# Patient Record
Sex: Female | Born: 1950 | Race: White | Hispanic: No | Marital: Married | Smoking: Never smoker
Health system: Southern US, Community
[De-identification: ages and names within clinical notes are randomized; demographics above are authoritative.]

## PROBLEM LIST (undated history)

## (undated) DIAGNOSIS — F419 Anxiety disorder, unspecified: Secondary | ICD-10-CM

## (undated) DIAGNOSIS — K589 Irritable bowel syndrome without diarrhea: Secondary | ICD-10-CM

## (undated) DIAGNOSIS — N809 Endometriosis, unspecified: Secondary | ICD-10-CM

## (undated) DIAGNOSIS — L719 Rosacea, unspecified: Secondary | ICD-10-CM

## (undated) HISTORY — DX: Rosacea, unspecified: L71.9

## (undated) HISTORY — PX: LAPAROSCOPIC OVARIAN CYSTECTOMY: SUR786

## (undated) HISTORY — PX: PELVIC LAPAROSCOPY: SHX162

## (undated) HISTORY — DX: Irritable bowel syndrome, unspecified: K58.9

## (undated) HISTORY — DX: Endometriosis, unspecified: N80.9

## (undated) HISTORY — DX: Anxiety disorder, unspecified: F41.9

## (undated) HISTORY — PX: COLONOSCOPY: SHX174

---

## 1997-11-20 ENCOUNTER — Other Ambulatory Visit: Admission: RE | Admit: 1997-11-20 | Discharge: 1997-11-20 | Payer: Self-pay | Admitting: Obstetrics and Gynecology

## 1999-04-01 ENCOUNTER — Other Ambulatory Visit: Admission: RE | Admit: 1999-04-01 | Discharge: 1999-04-01 | Payer: Self-pay | Admitting: Obstetrics and Gynecology

## 2000-11-01 ENCOUNTER — Other Ambulatory Visit: Admission: RE | Admit: 2000-11-01 | Discharge: 2000-11-01 | Payer: Self-pay | Admitting: Obstetrics and Gynecology

## 2001-01-05 ENCOUNTER — Encounter: Admission: RE | Admit: 2001-01-05 | Discharge: 2001-01-05 | Payer: Self-pay | Admitting: Family Medicine

## 2001-01-05 ENCOUNTER — Encounter: Payer: Self-pay | Admitting: Family Medicine

## 2001-07-18 ENCOUNTER — Emergency Department (HOSPITAL_COMMUNITY): Admission: EM | Admit: 2001-07-18 | Discharge: 2001-07-18 | Payer: Self-pay | Admitting: *Deleted

## 2001-11-06 ENCOUNTER — Other Ambulatory Visit: Admission: RE | Admit: 2001-11-06 | Discharge: 2001-11-06 | Payer: Self-pay | Admitting: Obstetrics and Gynecology

## 2003-03-12 ENCOUNTER — Other Ambulatory Visit: Admission: RE | Admit: 2003-03-12 | Discharge: 2003-03-12 | Payer: Self-pay | Admitting: Obstetrics and Gynecology

## 2003-09-02 ENCOUNTER — Emergency Department (HOSPITAL_COMMUNITY): Admission: EM | Admit: 2003-09-02 | Discharge: 2003-09-02 | Payer: Self-pay | Admitting: Emergency Medicine

## 2004-06-10 ENCOUNTER — Other Ambulatory Visit: Admission: RE | Admit: 2004-06-10 | Discharge: 2004-06-10 | Payer: Self-pay | Admitting: Obstetrics and Gynecology

## 2005-08-29 ENCOUNTER — Ambulatory Visit: Payer: Self-pay | Admitting: Gastroenterology

## 2005-09-23 ENCOUNTER — Other Ambulatory Visit: Admission: RE | Admit: 2005-09-23 | Discharge: 2005-09-23 | Payer: Self-pay | Admitting: Obstetrics and Gynecology

## 2006-11-02 ENCOUNTER — Other Ambulatory Visit: Admission: RE | Admit: 2006-11-02 | Discharge: 2006-11-02 | Payer: Self-pay | Admitting: Obstetrics and Gynecology

## 2007-07-06 ENCOUNTER — Encounter: Admission: RE | Admit: 2007-07-06 | Discharge: 2007-07-06 | Payer: Self-pay | Admitting: Family Medicine

## 2008-01-08 ENCOUNTER — Ambulatory Visit: Payer: Self-pay | Admitting: Obstetrics and Gynecology

## 2008-01-08 ENCOUNTER — Other Ambulatory Visit: Admission: RE | Admit: 2008-01-08 | Discharge: 2008-01-08 | Payer: Self-pay | Admitting: Obstetrics and Gynecology

## 2008-01-08 ENCOUNTER — Encounter: Payer: Self-pay | Admitting: Obstetrics and Gynecology

## 2008-01-22 ENCOUNTER — Ambulatory Visit: Payer: Self-pay | Admitting: Obstetrics and Gynecology

## 2008-12-12 IMAGING — RF DG ESOPHAGUS
10 series · 19 of 19 positions shown · non-contrast
Comparison: none

CLINICAL DATA: Dysphasia

BARIUM ESOPHAGRAM:
TECHNIQUE: After obtaining a scout radiograph, a double-contrast upper barium
esophagram  was performed using both high-density and thin barium.

[Series 1: run · 5 of 5 slices shown (1 of 10)]
[im 1/5]
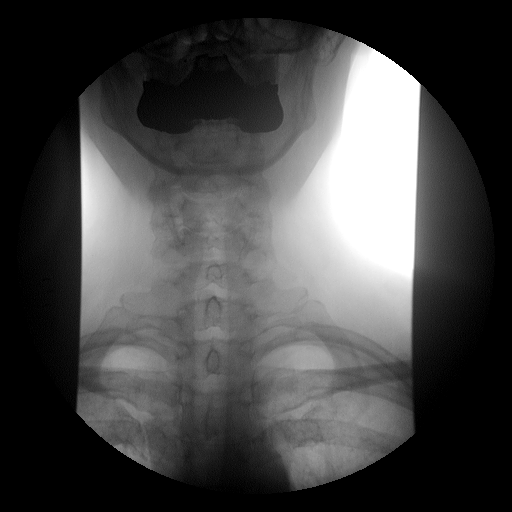
[im 2/5]
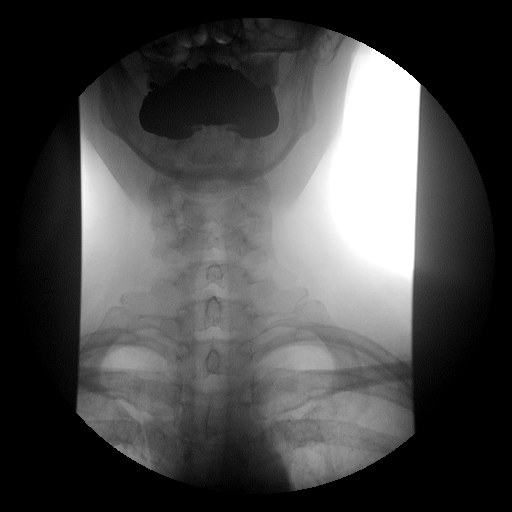
[im 3/5]
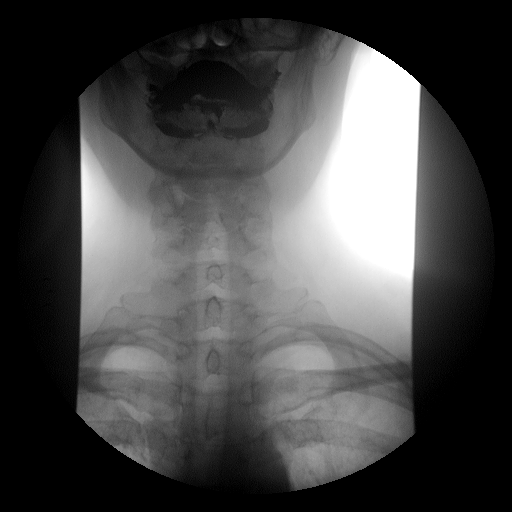
[im 4/5]
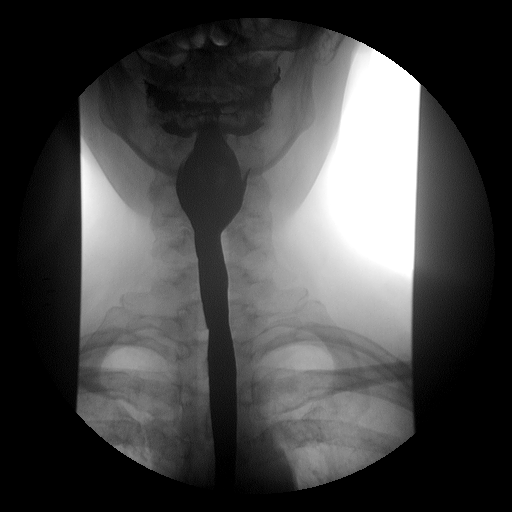
[im 5/5]
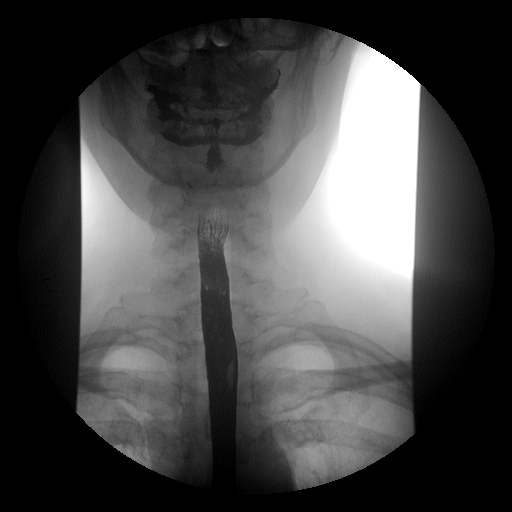

[Series 2: run · 6 of 6 slices shown (2 of 10)]
[im 1/6]
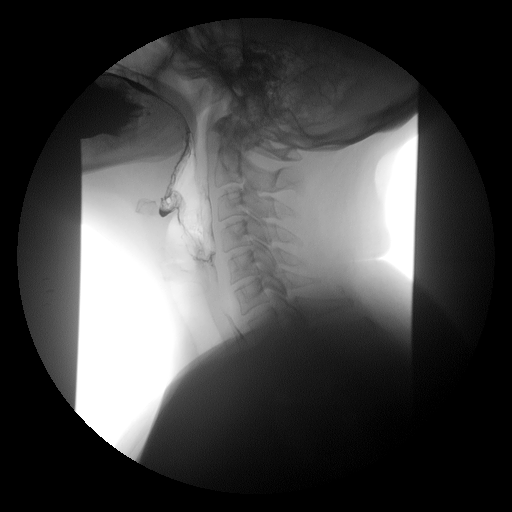
[im 2/6]
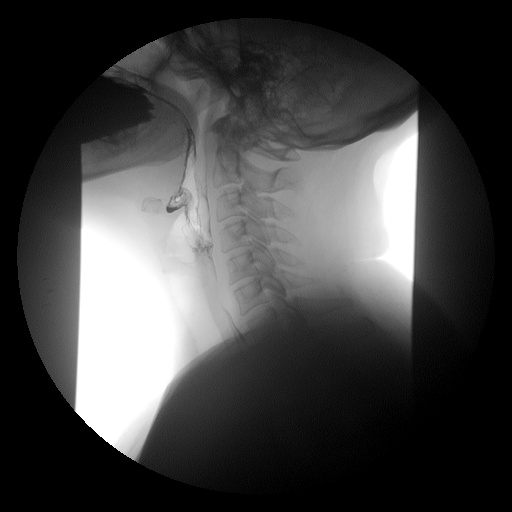
[im 3/6]
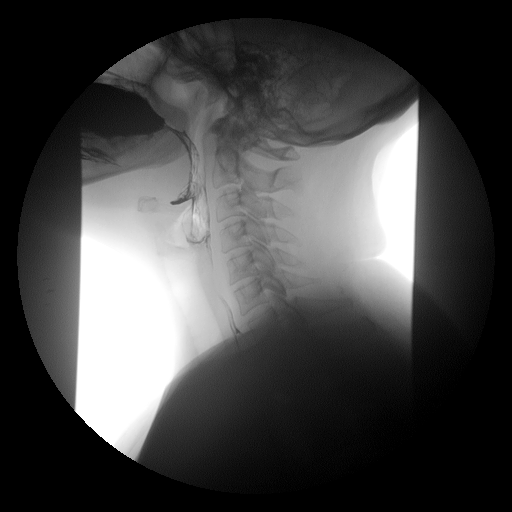
[im 4/6]
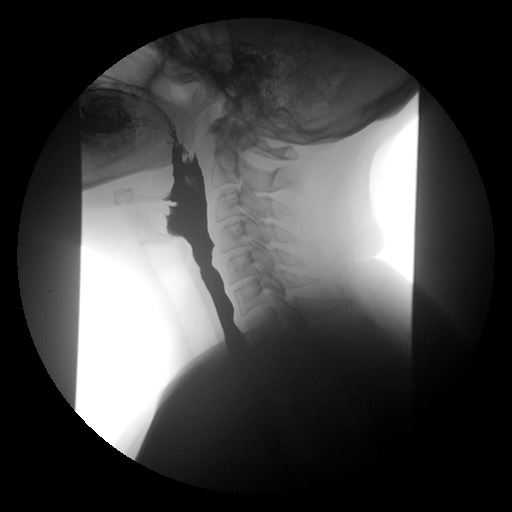
[im 5/6]
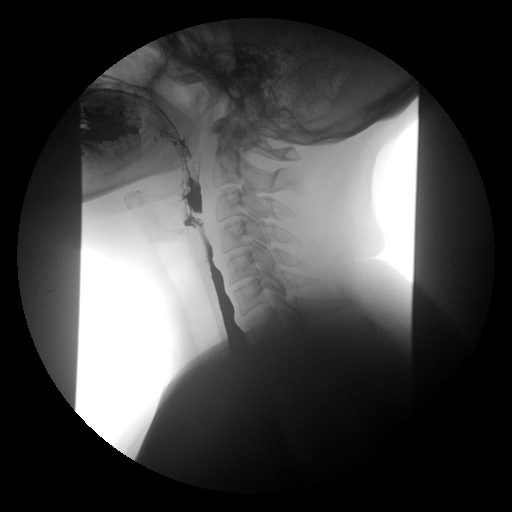
[im 6/6]
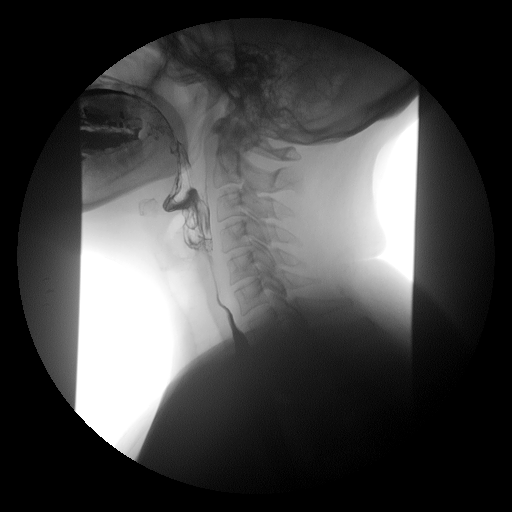

[Series 3: run · 1 of 1 slices shown (3 of 10)]
[im 1/1]
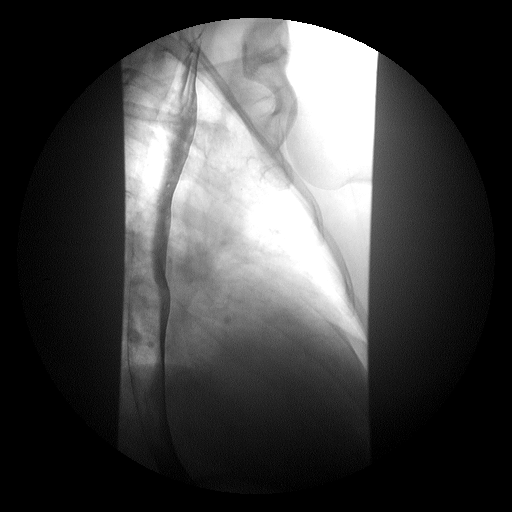

[Series 4: run · 1 of 1 slices shown (4 of 10)]
[im 1/1]
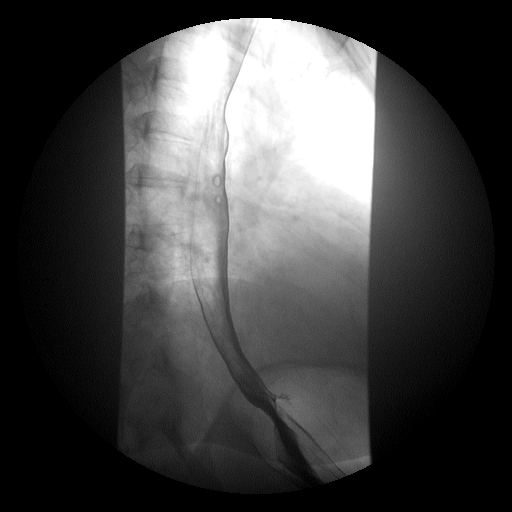

[Series 5: run · 1 of 1 slices shown (5 of 10)]
[im 1/1]
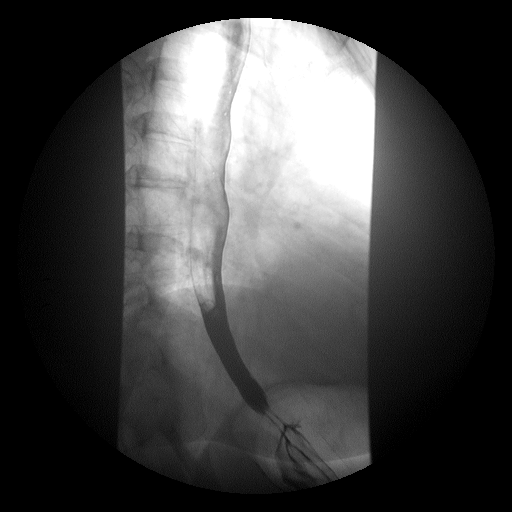

[Series 6: run · 1 of 1 slices shown (6 of 10)]
[im 1/1]
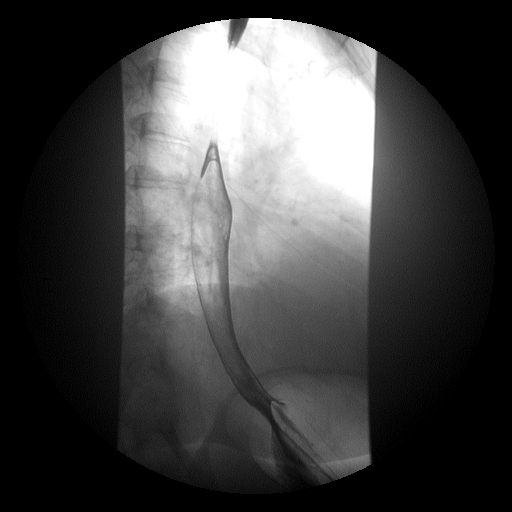

[Series 7: run · 1 of 1 slices shown (7 of 10)]
[im 1/1]
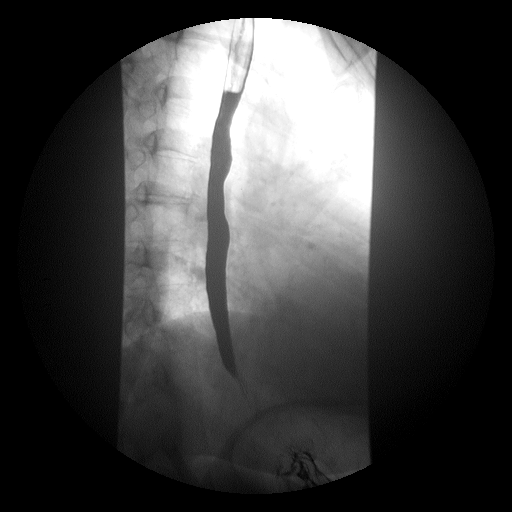

[Series 8: run · 1 of 1 slices shown (8 of 10)]
[im 1/1]
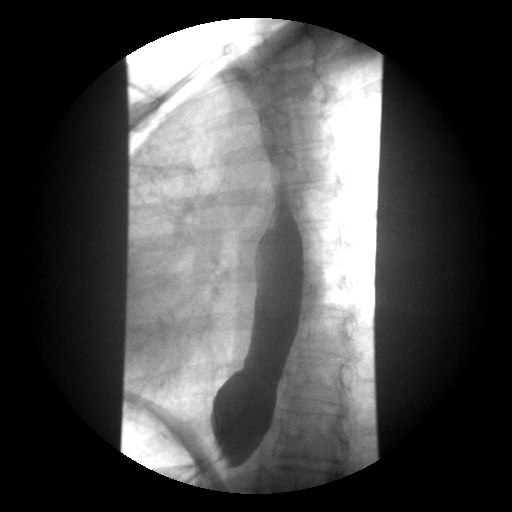

[Series 9: run · 1 of 1 slices shown (9 of 10)]
[im 1/1]
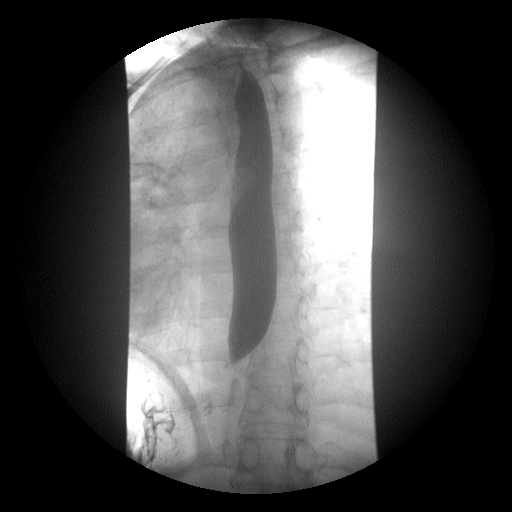

[Series 10: run · 1 of 1 slices shown (10 of 10)]
[im 1/1]
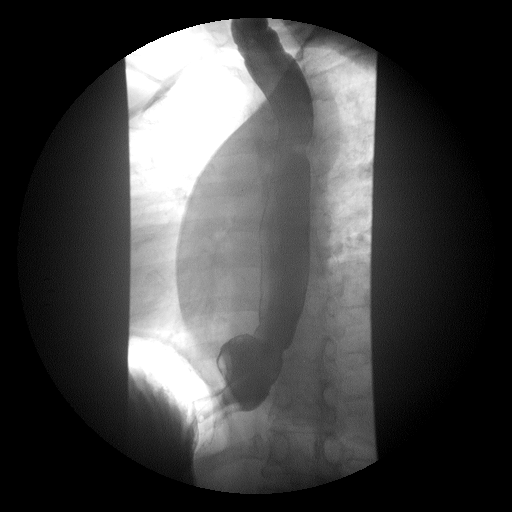

[19 of 19 positions shown; findings below may reference images not displayed]

FINDINGS: Frontal and lateral views of the hypopharynx while swallowing are
normal.  Double contrast evaluation of the esophagus is normal without evidence
for ulcer, stricture, mass effect or diverticulum.  No evidence for hiatal
hernia.  Swallowing in an RAO prone position shows disruption of primary
peristalsis on multiple swallows. A 13mm barium tablet passes readily into the
stomach when taken with water.
IMPRESSION: Nonspecific esophageal motility disorder. Otherwise normal exam.

## 2009-03-23 ENCOUNTER — Other Ambulatory Visit: Admission: RE | Admit: 2009-03-23 | Discharge: 2009-03-23 | Payer: Self-pay | Admitting: Obstetrics and Gynecology

## 2009-03-23 ENCOUNTER — Ambulatory Visit: Payer: Self-pay | Admitting: Obstetrics and Gynecology

## 2009-05-22 ENCOUNTER — Ambulatory Visit: Payer: Self-pay | Admitting: Obstetrics and Gynecology

## 2010-05-16 ENCOUNTER — Encounter: Payer: Self-pay | Admitting: Family Medicine

## 2010-06-10 ENCOUNTER — Encounter: Payer: Self-pay | Admitting: Obstetrics and Gynecology

## 2010-07-05 ENCOUNTER — Encounter: Payer: Self-pay | Admitting: Obstetrics and Gynecology

## 2010-07-12 ENCOUNTER — Encounter (INDEPENDENT_AMBULATORY_CARE_PROVIDER_SITE_OTHER): Payer: BC Managed Care – PPO | Admitting: Obstetrics and Gynecology

## 2010-07-12 ENCOUNTER — Other Ambulatory Visit: Payer: Self-pay | Admitting: Obstetrics and Gynecology

## 2010-07-12 ENCOUNTER — Other Ambulatory Visit (HOSPITAL_COMMUNITY)
Admission: RE | Admit: 2010-07-12 | Discharge: 2010-07-12 | Disposition: A | Discharge status: Home / Self Care | Payer: BC Managed Care – PPO | Source: Ambulatory Visit | Attending: Obstetrics and Gynecology | Admitting: Obstetrics and Gynecology

## 2010-07-12 DIAGNOSIS — Z124 Encounter for screening for malignant neoplasm of cervix: Secondary | ICD-10-CM | POA: Insufficient documentation

## 2010-07-12 DIAGNOSIS — Z01419 Encounter for gynecological examination (general) (routine) without abnormal findings: Secondary | ICD-10-CM

## 2010-07-12 DIAGNOSIS — R82998 Other abnormal findings in urine: Secondary | ICD-10-CM

## 2012-08-01 ENCOUNTER — Encounter: Payer: Self-pay | Admitting: Women's Health

## 2012-08-01 ENCOUNTER — Other Ambulatory Visit (HOSPITAL_COMMUNITY)
Admission: RE | Admit: 2012-08-01 | Discharge: 2012-08-01 | Disposition: A | Discharge status: Home / Self Care | Payer: BC Managed Care – PPO | Source: Ambulatory Visit | Attending: Obstetrics and Gynecology | Admitting: Obstetrics and Gynecology

## 2012-08-01 ENCOUNTER — Ambulatory Visit (INDEPENDENT_AMBULATORY_CARE_PROVIDER_SITE_OTHER): Payer: BC Managed Care – PPO | Admitting: Women's Health

## 2012-08-01 VITALS — BP 112/66 | Ht 65.75 in | Wt 144.0 lb

## 2012-08-01 DIAGNOSIS — Z780 Asymptomatic menopausal state: Secondary | ICD-10-CM

## 2012-08-01 DIAGNOSIS — Z01419 Encounter for gynecological examination (general) (routine) without abnormal findings: Secondary | ICD-10-CM | POA: Insufficient documentation

## 2012-08-01 NOTE — Patient Instructions (Addendum)

## 2012-08-01 NOTE — Progress Notes (Signed)
Terri Henderson 18-Apr-1951 [id number]    History:    The patient presents for annual exam.  Postmenopausal on no HRT with no bleeding. Normal Pap and mammogram history. Negative colonoscopy at age 62. Zostavac and normal labs at primary care. Has lost over 60 pounds in the past 4 years with diet/ exercise and stress. Normal DEXA 2009, T score 1.7 AP  Spine, bilateral hip average -0.1,  FRAX 5.2%/0.1%.   Past medical history, past surgical history, family history and social history were all reviewed and documented in the EPIC chart. 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  Husband has had numerous health issues but is doing better.   ROS:  A  ROS was performed and pertinent positives and negatives are included in the history.  Exam:  Filed Vitals:   08/01/12 1128  BP: 112/66    General appearance:  Normal Head/Neck:  Normal, without cervical or supraclavicular adenopathy. Thyroid:  Symmetrical, normal in size, without palpable masses or nodularity. Respiratory  Effort:  Normal  Auscultation:  Clear without wheezing or rhonchi Cardiovascular  Auscultation:  Regular rate, without rubs, murmurs or gallops  Edema/varicosities:  Not grossly evident Abdominal  Soft,nontender, without masses, guarding or rebound.  Liver/spleen:  No organomegaly noted  Hernia:  None appreciated  Skin  Inspection:  Grossly normal  Palpation:  Grossly normal Neurologic/psychiatric  Orientation:  Normal with appropriate conversation.  Mood/affect:  Normal  Genitourinary    Breasts: Examined lying and sitting.     Right: Without masses, retractions, discharge or axillary adenopathy.     Left: Without masses, retractions, discharge or axillary adenopathy.   Inguinal/mons:  Normal without inguinal adenopathy  External genitalia:  Normal  BUS/Urethra/Skene's glands:  Normal  Bladder:  Normal  Vagina:  Normal  Cervix:  Normal  Uterus:  normal in size, shape and  contour.  Midline and mobile  Adnexa/parametria:     Rt: Without masses or tenderness.   Lt: Without masses or tenderness.  Anus and perineum: Normal  Digital rectal exam: Normal sphincter tone without palpated masses or tenderness  Assessment/Plan:  62 y.o. M. WF G0 for annual exam with no complaints.  Normal postmenopausal exam with mild vaginal atrophy/not sexually active Normal DEXA 2009 Negative colonoscopy 2005 Labs-primary care-reports normal  Plan: Repeat DEXA, continue regular exercise, working with a trainer, calcium rich diet, vitamin D 2000 daily encouraged. Home safety and fall prevention discussed. SBE's, continue annual mammogram, had normal 1 yesterday. Home Hemoccult card given with instructions, repeat colonoscopy next year. Pap, normal Pap 2012, new screening guidelines reviewed.       Harrington Challenger [facility], 12:37 PM 08/01/2012

## 2012-08-07 ENCOUNTER — Encounter: Payer: Self-pay | Admitting: Obstetrics and Gynecology

## 2012-08-30 ENCOUNTER — Other Ambulatory Visit: Payer: Self-pay | Admitting: Gynecology

## 2012-08-30 DIAGNOSIS — Z780 Asymptomatic menopausal state: Secondary | ICD-10-CM

## 2012-09-06 ENCOUNTER — Encounter: Payer: Self-pay | Admitting: Internal Medicine

## 2012-09-18 ENCOUNTER — Ambulatory Visit (INDEPENDENT_AMBULATORY_CARE_PROVIDER_SITE_OTHER): Payer: BC Managed Care – PPO

## 2012-09-18 DIAGNOSIS — Z780 Asymptomatic menopausal state: Secondary | ICD-10-CM

## 2012-09-18 DIAGNOSIS — Z13820 Encounter for screening for osteoporosis: Secondary | ICD-10-CM

## 2012-10-01 ENCOUNTER — Telehealth: Payer: Self-pay | Admitting: *Deleted

## 2012-10-01 NOTE — Telephone Encounter (Signed)
Pt called with a couple of questions:  1. On her recent bone density pt said on the comprasion it said that she had decrease in right hip. Pt said she would like to know more about that.  2. On annual visit she spoke with you about having trouble sleeping, she use to take Ambien but stopped because of side effects. Pt said that you spoke with her about a medication with fewer sided effects?  3. Pt also said you spoke with her regarding vaginal dryness and she would like to know what she could use for this. Please advise.  Her call back # is [phone] if needed.

## 2012-10-01 NOTE — Telephone Encounter (Signed)
Telephone call, questions answered will use an over-the-counter vaginal lubricants, has not been sexually active husbands health is improving. Has Xanax 0.25 at home will try half tablet at bedtime for rest. Reviewed DEXA  FRAX 5.2%/0.1% spine T score 1.7 hip average -0.1.

## 2012-12-11 ENCOUNTER — Telehealth: Payer: Self-pay | Admitting: *Deleted

## 2012-12-11 NOTE — Telephone Encounter (Signed)
(  pt aware you are out of the office) Pt called c/o hot flashes are getting worse they come and go at night, trouble sleeping at night, problems with memory loss. Pt said this is not severe but has noticed it more frequently. I offered OV, pt asked me to relay this message to you. Please advise

## 2012-12-12 NOTE — Telephone Encounter (Signed)
Telephone call, options reviewed, is on Wellbutrin, instructed to take second dose at noon instead of after 4. Will try vitamin E twice daily. Postmenopausal 8 years, reviewed not go to start HRT at this time.

## 2012-12-12 NOTE — Telephone Encounter (Signed)
Message left

## 2013-05-22 ENCOUNTER — Encounter: Payer: Self-pay | Admitting: Internal Medicine

## 2013-07-11 ENCOUNTER — Encounter: Payer: Self-pay | Admitting: Internal Medicine

## 2013-08-14 ENCOUNTER — Ambulatory Visit (INDEPENDENT_AMBULATORY_CARE_PROVIDER_SITE_OTHER): Payer: BC Managed Care – PPO | Admitting: Women's Health

## 2013-08-14 ENCOUNTER — Encounter: Payer: Self-pay | Admitting: Women's Health

## 2013-08-14 VITALS — BP 116/70 | Ht 65.5 in | Wt 148.6 lb

## 2013-08-14 DIAGNOSIS — Z01419 Encounter for gynecological examination (general) (routine) without abnormal findings: Secondary | ICD-10-CM

## 2013-08-14 NOTE — Progress Notes (Signed)
Terri Henderson 1951/02/29 [id number]    History:    Presents for annual exam. Postmenopausal/ no HRT/ no bleeding. Normal Pap and mammogram history. Negative colonoscopy at age 63, repeat this year. Received Zostavac and had normal labs (CBC, CMP, TSH, Vit D, lipid panel) at primary care. Has lost over 60 pounds in the past 4 years with diet/ exercise and stress and has kept off. Normal DEXA 2009, T score 1.7 AP Spine, bilateral hip average -0.1, FRAX 5.2%/0.1%,  DEXA 2014 T score 0.6 AP Spine, bilateral hip average -0.4, significant decrease from 2009.  Past medical history, past surgical history, family history and social history were all reviewed and documented in the EPIC chart. [place] [employer] [place] [employer] and some tudoring. Husband has had numerous health issues but is doing better.  ROS:  A  ROS was performed and pertinent positives and negatives are included.  Exam:  Filed Vitals:   08/14/13 1202  BP: 116/70    General appearance:  Normal Thyroid:  Symmetrical, normal in size, without palpable masses or nodularity. Respiratory  Auscultation:  Clear without wheezing or rhonchi Cardiovascular  Auscultation:  Regular rate, without rubs, murmurs or gallops  Edema/varicosities:  Not grossly evident Abdominal  Soft,nontender, without masses, guarding or rebound.  Liver/spleen:  No organomegaly noted  Hernia:  None appreciated  Skin  Inspection:  Grossly normal   Breasts: Examined lying and sitting.     Right: Without masses, retractions, discharge or axillary adenopathy.     Left: Without masses, retractions, discharge or axillary adenopathy. Gentitourinary   Inguinal/mons:  Normal without inguinal adenopathy  External genitalia:  Normal  BUS/Urethra/Skene's glands:  Normal  Vagina:  atrophic  Cervix:  Normal  Uterus:  normal in size, shape and contour.  Midline and mobile  Adnexa/parametria:     Rt: Without masses or tenderness.   Lt: Without  masses or tenderness.  Anus and perineum: Normal  Digital rectal exam: Normal sphincter tone without palpated masses or tenderness  Assessment/Plan:  63 y.o.  M WF for annual exam with no complaints.    Normal postmenopausal exam with vaginal atrophy/rare sexual active  Normal DEXA 2014 but significant decrease since 2009 Labs-primary care- normal   Plan: Continue regular exercise, working with a trainer, calcium rich diet, vitamin D 2000 daily encouraged. Home safety and fall prevention discussed. SBE's, continue annual mammogram. Repeat colonoscopy. Normal Pap 2014, new screening guidelines reviewed. Repeat DEXA next year. Vaginal lubricants as needed with intercourse  Harrington Challengerancy J Allina Riches [facility], 12:46 PM 08/14/2013

## 2013-08-19 ENCOUNTER — Ambulatory Visit (AMBULATORY_SURGERY_CENTER): Payer: Self-pay | Admitting: *Deleted

## 2013-08-19 VITALS — Ht 66.0 in | Wt 150.0 lb

## 2013-08-19 DIAGNOSIS — Z1211 Encounter for screening for malignant neoplasm of colon: Secondary | ICD-10-CM

## 2013-08-19 MED ORDER — NA SULFATE-K SULFATE-MG SULF 17.5-3.13-1.6 GM/177ML PO SOLN: ORAL | Status: DC

## 2013-08-19 NOTE — Progress Notes (Signed)
Patient denies any allergies to eggs or soy. Patient denies any problems with anesthesia. No oxygen use at home, no diet/wt loss pills per patient. EMMI education given to patient on colonoscopy.

## 2013-08-21 ENCOUNTER — Encounter: Payer: Self-pay | Admitting: Internal Medicine

## 2013-09-12 ENCOUNTER — Encounter: Payer: BC Managed Care – PPO | Admitting: Internal Medicine

## 2013-10-24 ENCOUNTER — Encounter: Payer: Self-pay | Admitting: Internal Medicine

## 2013-10-24 ENCOUNTER — Ambulatory Visit (AMBULATORY_SURGERY_CENTER): Payer: BC Managed Care – PPO | Admitting: Internal Medicine

## 2013-10-24 VITALS — BP 126/71 | HR 64 | Temp 97.1°F | Resp 17 | Ht 66.0 in | Wt 150.0 lb

## 2013-10-24 DIAGNOSIS — Z1211 Encounter for screening for malignant neoplasm of colon: Secondary | ICD-10-CM

## 2013-10-24 MED ORDER — SODIUM CHLORIDE 0.9 % IV SOLN: 500.0000 mL | INTRAVENOUS | Status: DC

## 2013-10-24 NOTE — Progress Notes (Signed)
Procedure ends. To recovery, report given and VSS. 

## 2013-10-24 NOTE — Patient Instructions (Addendum)
Your colonoscopy was normal. Prep was great.  Next routine colonoscopy in 10 years - 2025  I appreciate the opportunity to care for you. Iva Booparl E. Quilla Freeze, MD, FACG  YOU HAD AN ENDOSCOPIC PROCEDURE TODAY AT THE [facility]: Refer to the procedure report that was given to you for any specific questions about what was found during the examination.  If the procedure report does not answer your questions, please call your gastroenterologist to clarify.  If you requested that your care partner not be given the details of your procedure findings, then the procedure report has been included in a sealed envelope for you to review at your convenience later.  YOU SHOULD EXPECT: Some feelings of bloating in the abdomen. Passage of more gas than usual.  Walking can help get rid of the air that was put into your GI tract during the procedure and reduce the bloating. If you had a lower endoscopy (such as a colonoscopy or flexible sigmoidoscopy) you may notice spotting of blood in your stool or on the toilet paper. If you underwent a bowel prep for your procedure, then you may not have a normal bowel movement for a few days.  DIET: Your first meal following the procedure should be a light meal and then it is ok to progress to your normal diet.  A half-sandwich or bowl of soup is an example of a good first meal.  Heavy or fried foods are harder to digest and may make you feel nauseous or bloated.  Likewise meals heavy in dairy and vegetables can cause extra gas to form and this can also increase the bloating.  Drink plenty of fluids but you should avoid alcoholic beverages for 24 hours.  ACTIVITY: Your care partner should take you home directly after the procedure.  You should plan to take it easy, moving slowly for the rest of the day.  You can resume normal activity the day after the procedure however you should NOT DRIVE or use heavy machinery for 24 hours (because of the sedation medicines used during  the test).    SYMPTOMS TO REPORT IMMEDIATELY: A gastroenterologist can be reached at any hour.  During normal business hours, 8:30 AM to 5:00 PM Monday through Friday, call [phone].  After hours and on weekends, please call the GI answering service at [phone] who will take a message and have the physician on call contact you.   Following lower endoscopy (colonoscopy or flexible sigmoidoscopy):  Excessive amounts of blood in the stool  Significant tenderness or worsening of abdominal pains  Swelling of the abdomen that is new, acute  Fever of 100F or higher    FOLLOW UP: If any biopsies were taken you will be contacted by phone or by letter within the next 1-3 weeks.  Call your gastroenterologist if you have not heard about the biopsies in 3 weeks.  Our staff will call the home number listed on your records the next business day following your procedure to check on you and address any questions or concerns that you may have at that time regarding the information given to you following your procedure. This is a courtesy call and so if there is no answer at the home number and we have not heard from you through the emergency physician on call, we will assume that you have returned to your regular daily activities without incident.  SIGNATURES/CONFIDENTIALITY: You and/or your care partner have signed paperwork which will be entered into your electronic  medical record.  These signatures attest to the fact that that the information above on your After Visit Summary has been reviewed and is understood.  Full responsibility of the confidentiality of this discharge information lies with you and/or your care-partner.  Try Miralax every day per Dr. Marvell FullerGessner's advices.

## 2013-10-24 NOTE — Op Note (Signed)
[facility] [address] [place], [id number]   COLONOSCOPY PROCEDURE REPORT  PATIENT: Terri Henderson, Terri O.  MR#: [id number] BIRTHDATE: 1951-04-22 , 62  yrs. old GENDER: Female ENDOSCOPIST: Iva Booparl E Gessner, MD, [facility] PROCEDURE DATE:  10/24/2013 PROCEDURE:   Colonoscopy, screening First Screening Colonoscopy - Avg.  risk and is 50 yrs.  old or older - No.  Prior Negative Screening - Now for repeat screening. 10 or more years since last screening  History of Adenoma - Now for follow-up colonoscopy & has been > or = to 3 yrs.  N/A  Polyps Removed Today? No.  Recommend repeat exam, <10 yrs? No. ASA CLASS:   Class I INDICATIONS:average risk screening and Last colonoscopy performed 10 years ago. MEDICATIONS: propofol (Diprivan) 250mg  IV, MAC sedation, administered by CRNA, and These medications were titrated to patient response per physician's verbal order  DESCRIPTION OF PROCEDURE:   After the risks benefits and alternatives of the procedure were thoroughly explained, informed consent was obtained.  A digital rectal exam revealed no abnormalities of the rectum.   The LB [id number]  endoscope was introduced through the anus and advanced to the cecum, which was identified by both the appendix and ileocecal valve. No adverse events experienced.   The quality of the prep was excellent using Suprep  The instrument was then slowly withdrawn as the colon was fully examined.      COLON FINDINGS: A normal appearing cecum, ileocecal valve, and appendiceal orifice were identified.  The ascending, hepatic flexure, transverse, splenic flexure, descending, sigmoid colon and rectum appeared unremarkable.  No polyps or cancers were seen. Retroflexed views revealed no abnormalities. The time to cecum=7 minutes 59 seconds.  Withdrawal time=6 minutes 50 seconds.  The scope was withdrawn and the procedure completed. COMPLICATIONS: There were no complications.  ENDOSCOPIC  IMPRESSION: Normal colonoscopy - excellent prep  RECOMMENDATIONS: Repeat colonoscopy 10 years - 2025   eSigned:  Iva Booparl E Gessner, MD, [facility]AC[facility] 10/24/2013 1:51 PM   cc: The Patient and Mila PalmerSharon Wolters, MD

## 2013-10-28 ENCOUNTER — Telehealth: Payer: Self-pay | Admitting: *Deleted

## 2013-10-28 NOTE — Telephone Encounter (Signed)
  Follow up Call-  Call back number 10/24/2013  Post procedure Call Back phone  # [phone]  Permission to leave phone message Yes     Patient questions:  Message left to call us if necessary.

## 2014-02-28 ENCOUNTER — Encounter: Payer: Self-pay | Admitting: Women's Health

## 2014-07-04 ENCOUNTER — Telehealth: Payer: Self-pay | Admitting: *Deleted

## 2014-07-04 NOTE — Telephone Encounter (Signed)
(  pt aware you are out of the office) Pt called c/o this am of some abdomen discomfort that lasted about 30 minutes, said discomfort was bilateral. She used heating pad and elevated her legs and took pain medication and felt better. I advised pt if the should occur again or worsen to go to ER after hours. Pt asked me to relay this information to you.

## 2014-07-07 NOTE — Telephone Encounter (Signed)
Please call and have her schedule office visit if symptoms persist,  Will check UA, wet prep and  if needed may need us

## 2014-07-07 NOTE — Telephone Encounter (Signed)
Pt informed with the below note. 

## 2014-08-20 ENCOUNTER — Encounter: Payer: BC Managed Care – PPO | Admitting: Women's Health

## 2014-09-15 ENCOUNTER — Encounter: Payer: Self-pay | Admitting: Women's Health

## 2014-09-15 ENCOUNTER — Ambulatory Visit (INDEPENDENT_AMBULATORY_CARE_PROVIDER_SITE_OTHER): Payer: BC Managed Care – PPO | Admitting: Women's Health

## 2014-09-15 VITALS — BP 112/70 | Ht 65.0 in | Wt 151.0 lb

## 2014-09-15 DIAGNOSIS — Z01419 Encounter for gynecological examination (general) (routine) without abnormal findings: Secondary | ICD-10-CM

## 2014-09-15 NOTE — Progress Notes (Signed)
Terri Henderson 03-01-1951 [id number]    History:    Presents for annual exam.  Postmenopausal/no HRT/no bleeding. History of endometriosis. Normal Pap and mammogram history. 2015 negative colonoscopy. History of rosacea, anxiety and depression on Wellbutrin and Prozac per primary care and IBS. Normal labs at primary care. Had lost 60 pounds in the past and has kept it off.  Past medical history, past surgical history, family history and social history were all reviewed and documented in the EPIC chart. [place] [employer] [place] [employer] [facility] [place].  ROS:  A ROS was performed and pertinent positives and negatives are included.  Exam:  Filed Vitals:   09/15/14 1041  BP: 112/70    General appearance:  Normal Thyroid:  Symmetrical, normal in size, without palpable masses or nodularity. Respiratory  Auscultation:  Clear without wheezing or rhonchi Cardiovascular  Auscultation:  Regular rate, without rubs, murmurs or gallops  Edema/varicosities:  Not grossly evident Abdominal  Soft,nontender, without masses, guarding or rebound.  Liver/spleen:  No organomegaly noted  Hernia:  None appreciated  Skin  Inspection:  Grossly normal   Breasts: Examined lying and sitting.     Right: Without masses, retractions, discharge or axillary adenopathy.     Left: Without masses, retractions, discharge or axillary adenopathy. Gentitourinary   Inguinal/mons:  Normal without inguinal adenopathy  External genitalia:  Normal  BUS/Urethra/Skene's glands:  Normal  Vagina:  Normal  Cervix:  Normal  Uterus:   normal in size, shape and contour.  Midline and mobile  Adnexa/parametria:     Rt: Without masses or tenderness.   Lt: Without masses or tenderness.  Anus and perineum: Normal  Digital rectal exam: Normal sphincter tone without palpated masses or tenderness  Assessment/Plan:  64 y.o. MWF G0 for annual exam with complaint of occasional insomnia.  Postmenopausal/no HRT/no  bleeding Rosacea- dermatologist manages Anxiety/depression stable on Wellbutrin and Prozac per primary care  Plan: SBE's, continue annual screening mammogram, calcium rich diet, vitamin D 1000 daily encouraged. Had normal vitamin D at primary care. Home safety, fall prevention discussed and importance of continued regular exercise for bone health. Pap normal 2014, new screening guidelines reviewed. Sleep hygiene reviewed. Normal DEXA 2014 will repeat next year.  Harrington ChallengerYOUNG,Terri Henderson [facility]WHNP, 1:43 PM 09/15/2014

## 2014-09-15 NOTE — Patient Instructions (Signed)

## 2015-06-23 ENCOUNTER — Encounter: Payer: Self-pay | Admitting: Women's Health

## 2015-11-03 ENCOUNTER — Ambulatory Visit (INDEPENDENT_AMBULATORY_CARE_PROVIDER_SITE_OTHER): Payer: BC Managed Care – PPO | Admitting: Women's Health

## 2015-11-03 ENCOUNTER — Encounter: Payer: Self-pay | Admitting: Women's Health

## 2015-11-03 VITALS — BP 120/78 | Ht 65.0 in | Wt 161.0 lb

## 2015-11-03 DIAGNOSIS — Z01419 Encounter for gynecological examination (general) (routine) without abnormal findings: Secondary | ICD-10-CM

## 2015-11-03 DIAGNOSIS — Z13820 Encounter for screening for osteoporosis: Secondary | ICD-10-CM

## 2015-11-03 NOTE — Addendum Note (Signed)
Addended by: Kem ParkinsonBARNES, Haivyn Oravec on: 11/03/2015 12:55 PM   Modules accepted: Orders, SmartSet

## 2015-11-03 NOTE — Patient Instructions (Signed)

## 2015-11-03 NOTE — Progress Notes (Signed)
Terri Henderson 12-Aug-1950 [id number]    History:    Presents for annual exam.  Postmenopausal on no HRT with no bleeding. History of endometriosis. Normal Pap and mammogram history. 2015 negative colonoscopy. Anxiety and depression managed by primary care. Rosacea dermatologist manages. Normal DEXA 2014 hip average -0.3.  Past medical history, past surgical history, family history and social history were all reviewed and documented in the EPIC chart. [facility] [employer] [place] [employer].  ROS:  A ROS was performed and pertinent positives and negatives are included.  Exam:  Filed Vitals:   11/03/15 1204  BP: 120/78    General appearance:  Normal Thyroid:  Symmetrical, normal in size, without palpable masses or nodularity. Respiratory  Auscultation:  Clear without wheezing or rhonchi Cardiovascular  Auscultation:  Regular rate, without rubs, murmurs or gallops  Edema/varicosities:  Not grossly evident Abdominal  Soft,nontender, without masses, guarding or rebound.  Liver/spleen:  No organomegaly noted  Hernia:  None appreciated  Skin  Inspection:  Grossly normal   Breasts: Examined lying and sitting.     Right: Without masses, retractions, discharge or axillary adenopathy.     Left: Without masses, retractions, discharge or axillary adenopathy. Gentitourinary   Inguinal/mons:  Normal without inguinal adenopathy  External genitalia:  Normal  BUS/Urethra/Skene's glands:  Normal  Vagina:  Normal  Cervix:  Normal  Uterus:   normal in size, shape and contour.  Midline and mobile  Adnexa/parametria:     Rt: Without masses or tenderness.   Lt: Without masses or tenderness.  Anus and perineum: Normal  Digital rectal exam: Normal sphincter tone without palpated masses or tenderness  Assessment/Plan:  65 y.o. MWF G0 for annual exam with no complaints.  Postmenopausal on no HRT with no bleeding Anxiety and depression labs and meds managed by primary  care Rosacea-dermatologist  Plan: DEXA. Reviewed importance of continuing weightbearing exercise, home safety and fall prevention discussed. Zostavax recommended . SBE's, continue annual 3-D screening mammogram, calcium rich diet, vitamin D 2000 daily encouraged. Instructed to have vitamin D level checked at primary care. Continue vaginal lubricants with intercourse. UA, Pap with HR HPV typing, new screening guidelines reviewed.  Harrington ChallengerYOUNG,NANC[facility] CenterWHNP, 12:48 PM 11/03/2015

## 2015-11-04 LAB — PAP IG W/ RFLX HPV ASCU

## 2015-11-23 ENCOUNTER — Other Ambulatory Visit: Payer: Self-pay | Admitting: Women's Health

## 2015-11-23 ENCOUNTER — Other Ambulatory Visit: Payer: Self-pay | Admitting: Gynecology

## 2015-11-23 ENCOUNTER — Ambulatory Visit (INDEPENDENT_AMBULATORY_CARE_PROVIDER_SITE_OTHER): Payer: BC Managed Care – PPO

## 2015-11-23 DIAGNOSIS — M8580 Other specified disorders of bone density and structure, unspecified site: Secondary | ICD-10-CM

## 2015-11-23 DIAGNOSIS — M899 Disorder of bone, unspecified: Secondary | ICD-10-CM

## 2015-11-23 DIAGNOSIS — Z13820 Encounter for screening for osteoporosis: Secondary | ICD-10-CM | POA: Diagnosis not present

## 2015-11-24 ENCOUNTER — Other Ambulatory Visit: Payer: Self-pay | Admitting: Gynecology

## 2015-11-24 DIAGNOSIS — M8580 Other specified disorders of bone density and structure, unspecified site: Secondary | ICD-10-CM

## 2015-11-25 ENCOUNTER — Other Ambulatory Visit: Payer: BC Managed Care – PPO

## 2015-11-25 DIAGNOSIS — M8580 Other specified disorders of bone density and structure, unspecified site: Secondary | ICD-10-CM

## 2015-11-26 LAB — VITAMIN D 25 HYDROXY (VIT D DEFICIENCY, FRACTURES): Vit D, 25-Hydroxy: 32 ng/mL (ref 30–100)

## 2015-11-26 LAB — PTH, INTACT AND CALCIUM
CALCIUM: 9.1 mg/dL (ref 8.4–10.5)
PTH: 36 pg/mL (ref 14–64)

## 2015-12-03 ENCOUNTER — Telehealth: Payer: Self-pay | Admitting: *Deleted

## 2015-12-03 NOTE — Telephone Encounter (Signed)
Pt called requesting lab results on 11/25/15 for vitamin d and pth and calcium

## 2015-12-07 ENCOUNTER — Ambulatory Visit
Admission: RE | Admit: 2015-12-07 | Discharge: 2015-12-07 | Disposition: A | Discharge status: Home / Self Care | Payer: BC Managed Care – PPO | Source: Ambulatory Visit | Attending: Family Medicine | Admitting: Family Medicine

## 2015-12-07 ENCOUNTER — Other Ambulatory Visit: Payer: Self-pay | Admitting: Family Medicine

## 2015-12-07 DIAGNOSIS — M79675 Pain in left toe(s): Secondary | ICD-10-CM

## 2015-12-07 DIAGNOSIS — M79672 Pain in left foot: Secondary | ICD-10-CM

## 2016-08-16 ENCOUNTER — Encounter: Payer: Self-pay | Admitting: Women's Health

## 2016-09-07 ENCOUNTER — Encounter: Payer: Self-pay | Admitting: Gynecology

## 2017-05-15 IMAGING — CR DG FOOT COMPLETE 3+V*L*
3 series · 3 of 3 positions shown · non-contrast
Comparison: None.

CLINICAL DATA: Injury to left foot 1 day ago. Pain and bruising to
fourth and fifth toes and metatarsals.

EXAM:
LEFT FOOT - COMPLETE 3+ VIEW

[t foot ap left]
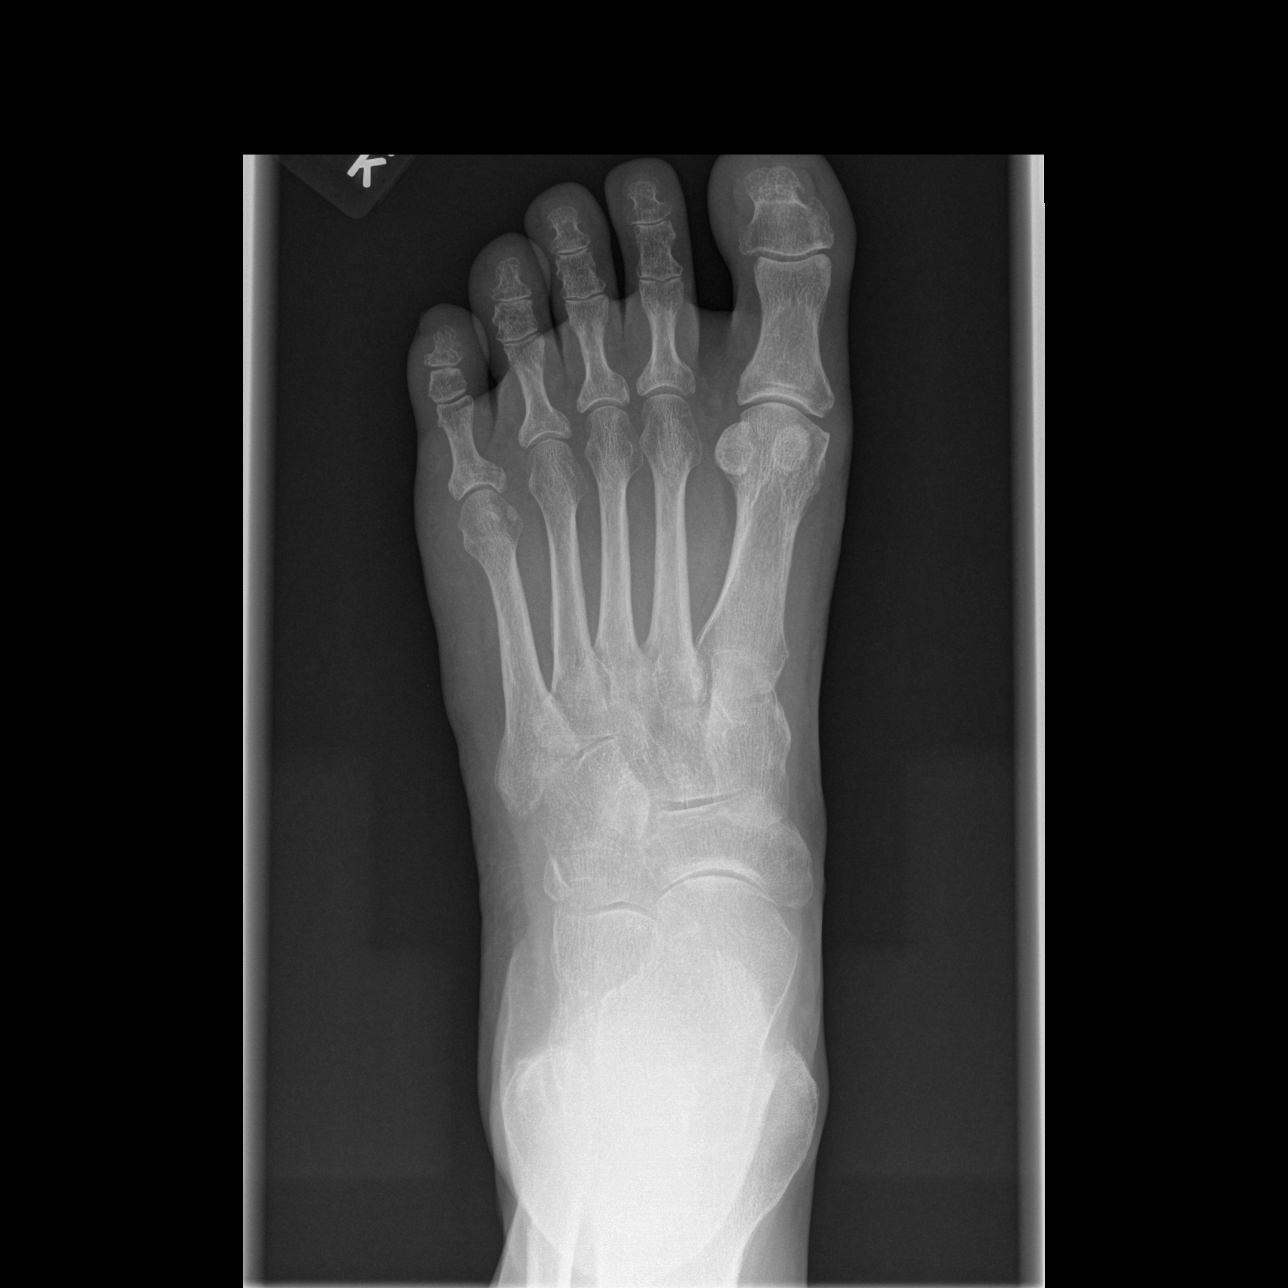

[t foot oblique left]
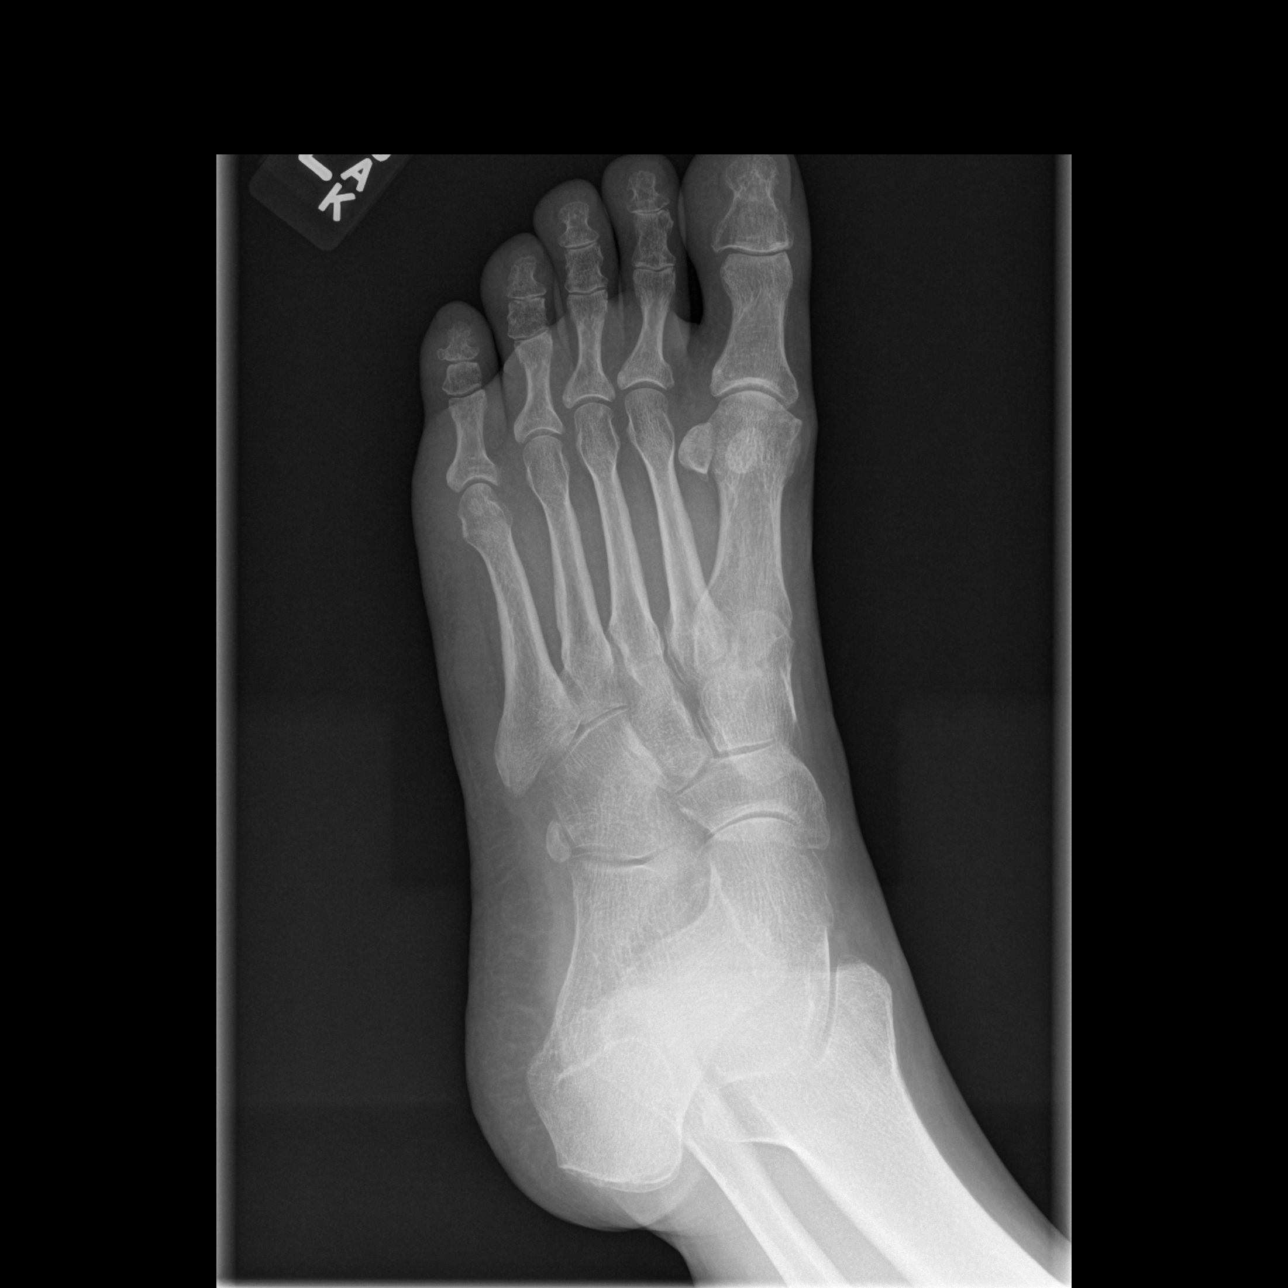

[t foot lat left]
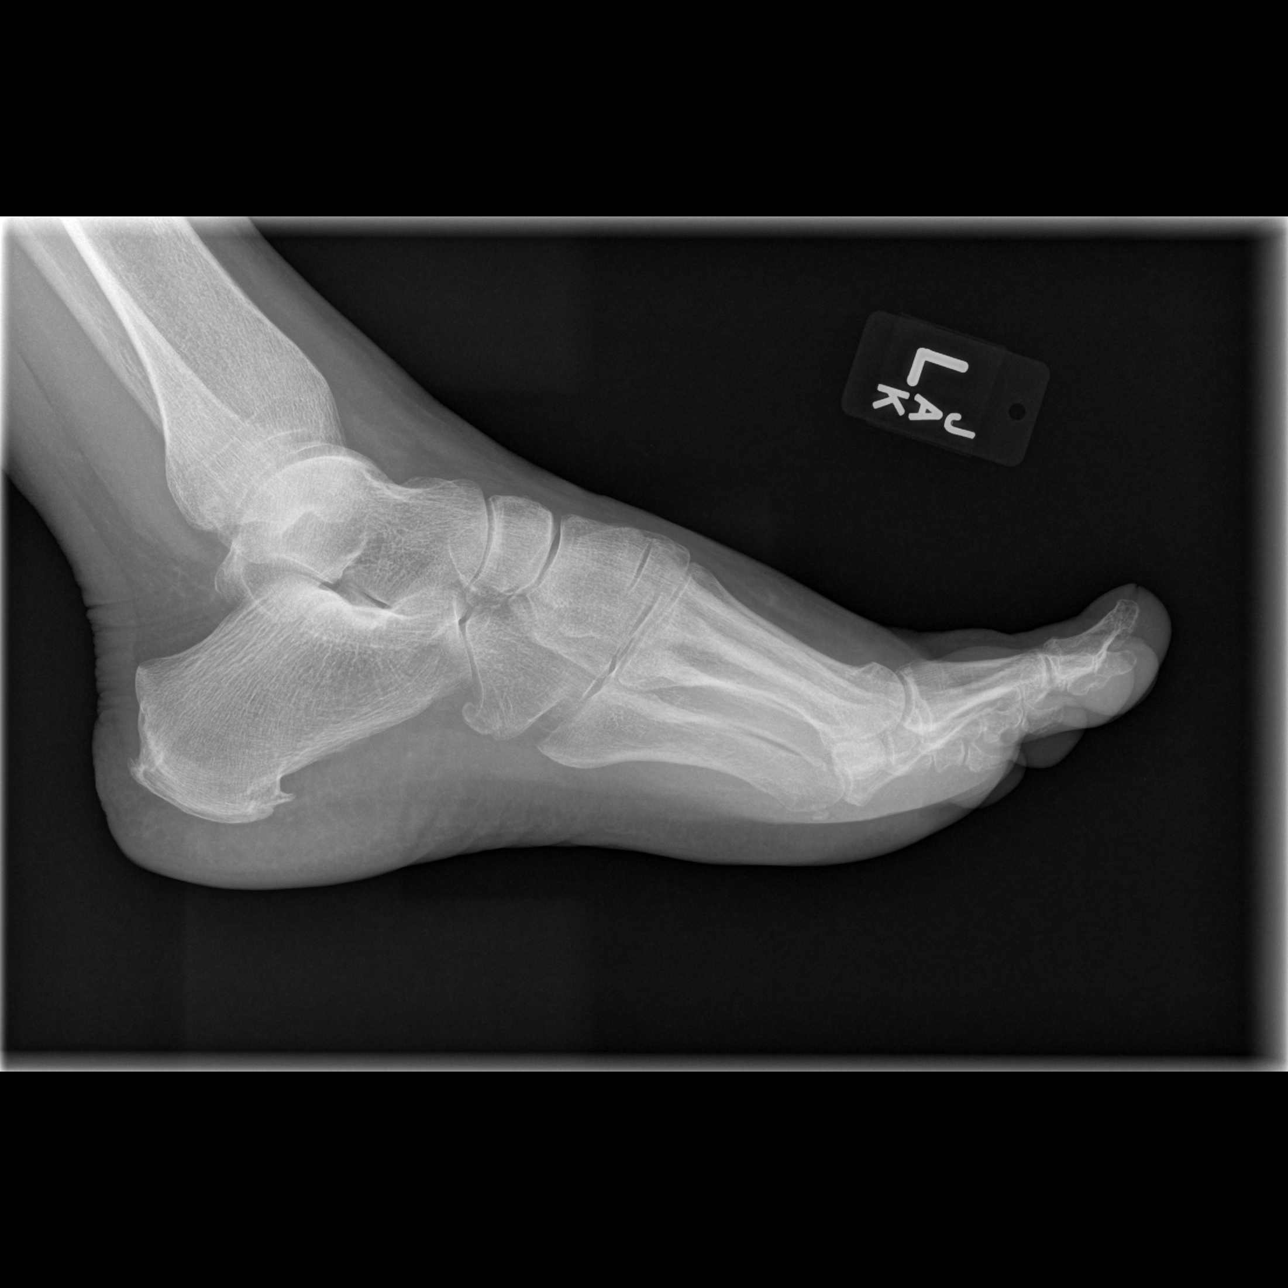

[3 of 3 positions shown; findings below may reference images not displayed]

FINDINGS: No acute bony abnormality. Specifically, no fracture, subluxation,
or dislocation. Soft tissues are intact. Plantar calcaneal spur.
IMPRESSION: No acute bony abnormality.

## 2017-11-24 ENCOUNTER — Encounter: Payer: Self-pay | Admitting: Women's Health

## 2018-05-08 ENCOUNTER — Ambulatory Visit: Payer: Medicare Other | Admitting: Women's Health

## 2018-05-08 ENCOUNTER — Encounter: Payer: Self-pay | Admitting: Women's Health

## 2018-05-08 VITALS — BP 128/80 | Ht 65.0 in | Wt 154.0 lb

## 2018-05-08 DIAGNOSIS — M8580 Other specified disorders of bone density and structure, unspecified site: Secondary | ICD-10-CM | POA: Insufficient documentation

## 2018-05-08 DIAGNOSIS — M810 Age-related osteoporosis without current pathological fracture: Secondary | ICD-10-CM | POA: Diagnosis not present

## 2018-05-08 DIAGNOSIS — F329 Major depressive disorder, single episode, unspecified: Secondary | ICD-10-CM | POA: Insufficient documentation

## 2018-05-08 DIAGNOSIS — Z01419 Encounter for gynecological examination (general) (routine) without abnormal findings: Secondary | ICD-10-CM

## 2018-05-08 DIAGNOSIS — F32A Depression, unspecified: Secondary | ICD-10-CM | POA: Insufficient documentation

## 2018-05-08 DIAGNOSIS — Z13820 Encounter for screening for osteoporosis: Secondary | ICD-10-CM

## 2018-05-08 DIAGNOSIS — Z780 Asymptomatic menopausal state: Secondary | ICD-10-CM

## 2018-05-08 NOTE — Progress Notes (Signed)
Terri Henderson Jan 29, 1951 [id number]    History:    Presents for breast and pelvic exam with no complaints.  Postmenopausal on no HRT with no bleeding.  Normal Pap and mammogram history.  2015- colonoscopy.  2017 T score -1.1 FRAX 7.8% / 0.5%.  Primary care manages medication for depression.  Using CBD oil for insomnia and sleeping better.  History of IBS and endometriosis.  Past medical history, past surgical history, family history and social history were all reviewed and documented in the EPIC chart.  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  Husband numerous health problems, indwelling catheter, history of a broken neck.  Rare sexual activity.  Has a standard poodle.  ROS:  A ROS was performed and pertinent positives and negatives are included.  Exam:  Vitals:   05/08/18 1420  BP: 128/80  Weight: 154 lb (69.9 kg)  Height: 5\' 5"  (1.651 m)   Body mass index is 25.63 kg/m.   General appearance:  Normal Thyroid:  Symmetrical, normal in size, without palpable masses or nodularity. Respiratory  Auscultation:  Clear without wheezing or rhonchi Cardiovascular  Auscultation:  Regular rate, without rubs, murmurs or gallops  Edema/varicosities:  Not grossly evident Abdominal  Soft,nontender, without masses, guarding or rebound.  Liver/spleen:  No organomegaly noted  Hernia:  None appreciated  Skin  Inspection:  Grossly normal   Breasts: Examined lying and sitting.     Right: Without masses, retractions, discharge or axillary adenopathy.     Left: Without masses, retractions, discharge or axillary adenopathy. Gentitourinary   Inguinal/mons:  Normal without inguinal adenopathy  External genitalia:  Normal  BUS/Urethra/Skene's glands:  Normal  Vagina: Atrophic  Cervix:  Normal  Uterus:  normal in size, shape and contour.  Midline and mobile  Adnexa/parametria:     Rt: Without masses or tenderness.   Lt: Without masses or tenderness.  Anus and  perineum: Normal  Digital rectal exam: Normal sphincter tone without palpated masses or tenderness  Assessment/Plan:  68 y.o. MWF G0 for breast and pelvic exam.  Postmenopausal on no HRT with no bleeding.   Mild osteopenia without elevated FRAX Depression-primary care manages labs and meds Insomnia primary care  Plan: Reviewed importance of weightbearing and balance type exercise, home safety, fall prevention discussed.  Encouraged yoga has done in the past.  SBEs, continue annual screening mammogram, calcium rich foods, vitamin D 2000 daily encouraged.  Shingrex reviewed and encouraged.  Has had Pneumovax.  Schedule DEXA.  Pap normal 2017, new screening guidelines reviewed.    [facility], 3:02 PM 05/08/2018

## 2018-05-08 NOTE — Patient Instructions (Signed)
shingrex   Health Maintenance After Age 67 After age 67, you are at a higher risk for certain long-term diseases and infections as well as injuries from falls. Falls are a major cause of broken bones and head injuries in people who are older than age 67. Getting regular preventive care can help to keep you healthy and well. Preventive care includes getting regular testing and making lifestyle changes as recommended by your health care provider. Talk with your health care provider about:  Which screenings and tests you should have. A screening is a test that checks for a disease when you have no symptoms.  A diet and exercise plan that is right for you. What should I know about screenings and tests to prevent falls? Screening and testing are the best ways to find a health problem early. Early diagnosis and treatment give you the best chance of managing medical conditions that are common after age 67. Certain conditions and lifestyle choices may make you more likely to have a fall. Your health care provider may recommend:  Regular vision checks. Poor vision and conditions such as cataracts can make you more likely to have a fall. If you wear glasses, make sure to get your prescription updated if your vision changes.  Medicine review. Work with your health care provider to regularly review all of the medicines you are taking, including over-the-counter medicines. Ask your health care provider about any side effects that may make you more likely to have a fall. Tell your health care provider if any medicines that you take make you feel dizzy or sleepy.  Osteoporosis screening. Osteoporosis is a condition that causes the bones to get weaker. This can make the bones weak and cause them to break more easily.  Blood pressure screening. Blood pressure changes and medicines to control blood pressure can make you feel dizzy.  Strength and balance checks. Your health care provider may recommend certain tests  to check your strength and balance while standing, walking, or changing positions.  Foot health exam. Foot pain and numbness, as well as not wearing proper footwear, can make you more likely to have a fall.  Depression screening. You may be more likely to have a fall if you have a fear of falling, feel emotionally low, or feel unable to do activities that you used to do.  Alcohol use screening. Using too much alcohol can affect your balance and may make you more likely to have a fall. What actions can I take to lower my risk of falls? General instructions  Talk with your health care provider about your risks for falling. Tell your health care provider if: ? You fall. Be sure to tell your health care provider about all falls, even ones that seem minor. ? You feel dizzy, sleepy, or off-balance.  Take over-the-counter and prescription medicines only as told by your health care provider. These include any supplements.  Eat a healthy diet and maintain a healthy weight. A healthy diet includes low-fat dairy products, low-fat (lean) meats, and fiber from whole grains, beans, and lots of fruits and vegetables. Home safety  Remove any tripping hazards, such as rugs, cords, and clutter.  Install safety equipment such as grab bars in bathrooms and safety rails on stairs.  Keep rooms and walkways well-lit. Activity   Follow a regular exercise program to stay fit. This will help you maintain your balance. Ask your health care provider what types of exercise are appropriate for you.  If you need   a cane or walker, use it as recommended by your health care provider.  Wear supportive shoes that have nonskid soles. Lifestyle  Do not drink alcohol if your health care provider tells you not to drink.  If you drink alcohol, limit how much you have: ? 0-1 drink a day for women. ? 0-2 drinks a day for men.  Be aware of how much alcohol is in your drink. In the [place] one drink equals one typical  bottle of beer (12 oz), one-half glass of wine (5 oz), or one shot of hard liquor (1 oz).  Do not use any products that contain nicotine or tobacco, such as cigarettes and e-cigarettes. If you need help quitting, ask your health care provider. Summary  Having a healthy lifestyle and getting preventive care can help to protect your health and wellness after age 67.  Screening and testing are the best way to find a health problem early and help you avoid having a fall. Early diagnosis and treatment give you the best chance for managing medical conditions that are more common for people who are older than age 67.  Falls are a major cause of broken bones and head injuries in people who are older than age 67. Take precautions to prevent a fall at home.  Work with your health care provider to learn what changes you can make to improve your health and wellness and to prevent falls. This information is not intended to replace advice given to you by your health care provider. Make sure you discuss any questions you have with your health care provider. Document Released: 02/22/2017 Document Revised: 02/22/2017 Document Reviewed: 02/22/2017 Elsevier Interactive Patient Education  2019 Elsevier Inc.  

## 2018-06-13 ENCOUNTER — Ambulatory Visit (INDEPENDENT_AMBULATORY_CARE_PROVIDER_SITE_OTHER): Payer: Medicare Other

## 2018-06-13 DIAGNOSIS — Z780 Asymptomatic menopausal state: Secondary | ICD-10-CM

## 2018-06-13 DIAGNOSIS — M8589 Other specified disorders of bone density and structure, multiple sites: Secondary | ICD-10-CM

## 2018-06-13 DIAGNOSIS — Z13820 Encounter for screening for osteoporosis: Secondary | ICD-10-CM

## 2018-06-14 ENCOUNTER — Other Ambulatory Visit: Payer: Self-pay | Admitting: Women's Health

## 2018-06-14 ENCOUNTER — Other Ambulatory Visit: Payer: Self-pay | Admitting: Obstetrics & Gynecology

## 2018-06-14 DIAGNOSIS — Z780 Asymptomatic menopausal state: Secondary | ICD-10-CM

## 2018-06-14 DIAGNOSIS — M8589 Other specified disorders of bone density and structure, multiple sites: Secondary | ICD-10-CM

## 2019-01-11 ENCOUNTER — Encounter: Payer: Self-pay | Admitting: Women's Health

## 2019-04-25 ENCOUNTER — Ambulatory Visit: Payer: Medicare Other | Attending: Internal Medicine

## 2019-04-25 DIAGNOSIS — Z20822 Contact with and (suspected) exposure to covid-19: Secondary | ICD-10-CM

## 2019-04-27 LAB — NOVEL CORONAVIRUS, NAA: SARS-CoV-2, NAA: NOT DETECTED

## 2019-05-14 ENCOUNTER — Encounter: Payer: Medicare Other | Admitting: Women's Health

## 2019-05-24 ENCOUNTER — Ambulatory Visit: Payer: Medicare Other

## 2019-05-30 ENCOUNTER — Ambulatory Visit: Payer: Medicare PPO | Attending: Internal Medicine

## 2019-05-30 DIAGNOSIS — Z23 Encounter for immunization: Secondary | ICD-10-CM

## 2019-05-30 NOTE — Progress Notes (Signed)
   Covid-19 Vaccination Clinic  Name:  MADELIN WESEMAN    MRN: [id number] DOB: 1951/02/22  05/30/2019  Ms. Koplin was observed post Covid-19 immunization for 15 minutes without incidence. She was provided with Vaccine Information Sheet and instruction to access the V-Safe system.   Ms. Junio was instructed to call 911 with any severe reactions post vaccine: Marland Kitchen Difficulty breathing  . Swelling of your face and throat  . A fast heartbeat  . A bad rash all over your body  . Dizziness and weakness    Immunizations Administered    Name Date Dose VIS Date Route   Pfizer COVID-19 Vaccine 05/30/2019  5:03 PM 0.3 mL 04/05/2019 Intramuscular   Manufacturer: [employer], [employer]   Lot: [id number]   NDC: [address]-3

## 2019-06-04 ENCOUNTER — Ambulatory Visit: Payer: Medicare Other

## 2019-06-17 ENCOUNTER — Encounter: Payer: Medicare PPO | Admitting: Women's Health

## 2019-06-25 ENCOUNTER — Ambulatory Visit: Payer: Medicare PPO | Attending: Internal Medicine

## 2019-06-25 DIAGNOSIS — Z23 Encounter for immunization: Secondary | ICD-10-CM | POA: Insufficient documentation

## 2019-06-25 NOTE — Progress Notes (Signed)
   Covid-19 Vaccination Clinic  Name:  Terri Henderson    MRN: [id number] DOB: 22-Aug-1950  06/25/2019  [name] was observed post Covid-19 immunization for 15 minutes without incident. She was provided with Vaccine Information Sheet and instruction to access the V-Safe system.   [name] was instructed to call 911 with any severe reactions post vaccine: Marland Kitchen Difficulty breathing  . Swelling of face and throat  . A fast heartbeat  . A bad rash all over body  . Dizziness and weakness   Immunizations Administered    Name Date Dose VIS Date Route   Pfizer COVID-19 Vaccine 06/25/2019  1:35 PM 0.3 mL 04/05/2019 Intramuscular   Manufacturer: [employer], [employer]   Lot: [id number]   NDC: [address]-3

## 2019-07-30 ENCOUNTER — Encounter: Payer: Medicare PPO | Admitting: Women's Health

## 2019-08-13 ENCOUNTER — Encounter: Payer: Medicare PPO | Admitting: Women's Health

## 2019-09-09 ENCOUNTER — Other Ambulatory Visit: Payer: Self-pay

## 2019-09-10 ENCOUNTER — Encounter: Payer: Self-pay | Admitting: Nurse Practitioner

## 2019-09-10 ENCOUNTER — Ambulatory Visit (INDEPENDENT_AMBULATORY_CARE_PROVIDER_SITE_OTHER): Payer: Medicare PPO | Admitting: Nurse Practitioner

## 2019-09-10 VITALS — BP 120/80 | Ht 65.0 in | Wt 161.0 lb

## 2019-09-10 DIAGNOSIS — M8589 Other specified disorders of bone density and structure, multiple sites: Secondary | ICD-10-CM

## 2019-09-10 DIAGNOSIS — N952 Postmenopausal atrophic vaginitis: Secondary | ICD-10-CM

## 2019-09-10 DIAGNOSIS — N898 Other specified noninflammatory disorders of vagina: Secondary | ICD-10-CM

## 2019-09-10 DIAGNOSIS — Z780 Asymptomatic menopausal state: Secondary | ICD-10-CM | POA: Diagnosis not present

## 2019-09-10 DIAGNOSIS — M85851 Other specified disorders of bone density and structure, right thigh: Secondary | ICD-10-CM

## 2019-09-10 DIAGNOSIS — Z01419 Encounter for gynecological examination (general) (routine) without abnormal findings: Secondary | ICD-10-CM | POA: Diagnosis not present

## 2019-09-10 LAB — WET PREP FOR TRICH, YEAST, CLUE

## 2019-09-10 NOTE — Progress Notes (Signed)
   Terri Henderson 05-09-1950 [id number]   History:  69 y.o.MWF G0 presents for breast and pelvic exam with no complaints.  Postmenopausal - patient at [facility], has been receiving testosterone pellets for 2 and half years and progesterone daily for 3 months for insomnia with good relief.  History of IBS, vitamin D deficiency, endometriosis, osteopenia.  In the past she took 5000 units vitamin D daily, this was a good maintenance level for her.  Has not been as active this year due to the pandemic and has not been consistent with taking vitamin D daily.  Saw urology in March for incontinence with a negative work-up.  Not currently sexually active due to husband's health.  Gynecologic History No LMP recorded. Patient is postmenopausal.   Last Pap: 11/03/2015. Results were: normal Last mammogram: 01/01/2019. Results were: normal Colonoscopy: 10/24/2013. Results were: Normal Dexa: 06/14/2018. Results were: T-score -1.2 FRAX 8.6% / 0.8%. Osteopenic at bilateral hip  Past medical history, past surgical history, family history and social history were all reviewed and documented in the EPIC chart. Retired Runner, broadcasting/film/video.  ROS:  A ROS was performed and pertinent positives and negatives are included.  Exam:  Vitals:   09/10/19 1406  BP: 120/80  Weight: 161 lb (73 kg)  Height: 5\' 5"  (1.651 m)   Body mass index is 26.79 kg/m.  General appearance:  Normal Thyroid:  Symmetrical, normal in size, without palpable masses or nodularity. Respiratory  Auscultation:  Clear without wheezing or rhonchi Cardiovascular  Auscultation:  Regular rate, without rubs, murmurs or gallops  Edema/varicosities:  Not grossly evident Abdominal  Soft,nontender, without masses, guarding or rebound.  Liver/spleen:  No organomegaly noted  Hernia:  None appreciated  Skin  Inspection:  Grossly normal   Breasts: Examined lying and sitting.   Right: Without masses, retractions, discharge or axillary adenopathy.   Left: Without  masses, retractions, discharge or axillary adenopathy. Gentitourinary   Inguinal/mons:  Normal without inguinal adenopathy  External genitalia:  Normal  BUS/Urethra/Skene's glands:  Normal  Vagina:  Atrophic changes, yellow discharge  Cervix:  Normal  Uterus:  Normal in size, shape and contour.  Midline and mobile  Adnexa/parametria:     Rt: Without masses or tenderness.   Lt: Without masses or tenderness.  Anus and perineum: Normal  Digital rectal exam: Normal sphincter tone without palpated masses or tenderness  Assessment/Plan:  69 y.o. MWF G0 for breast and pelvic exam.   Well female exam with routine gynecological exam  - Education provided on SBEs, importance of preventative screenings, current guidelines, high calcium diet, regular exercise, and multivitamin daily.   Osteopenia of both hips - encouraged to consistently take 5000 units Vitamin D daily as this was her normal maintenance dose in the past for deficiency. Increase exercise and include weight-bearing and balancing exercises.   Postmenopausal - complains of vaginal dryness. Recommended Replenz OTC every 2-3 days. Incontinence most likely due to vaginal atrophy. Educated on Kegel exercises. Follow up with urology as needed.   Vaginal discharge - Plan: WET PREP FOR TRICH, YEAST, CLUE  Follow-up in 1 year for annual     Joelle Flessner [facility], 2:11 PM 09/10/2019

## 2019-09-10 NOTE — Patient Instructions (Addendum)
Calcium 1200 units daily Vitamin D supplement daily  Replenz over the counter. Use every 2-3 days   Health Maintenance After Age 68 After age 55, you are at a higher risk for certain long-term diseases and infections as well as injuries from falls. Falls are a major cause of broken bones and head injuries in people who are older than age 19. Getting regular preventive care can help to keep you healthy and well. Preventive care includes getting regular testing and making lifestyle changes as recommended by your health care provider. Talk with your health care provider about:  Which screenings and tests you should have. A screening is a test that checks for a disease when you have no symptoms.  A diet and exercise plan that is right for you. What should I know about screenings and tests to prevent falls? Screening and testing are the best ways to find a health problem early. Early diagnosis and treatment give you the best chance of managing medical conditions that are common after age 60. Certain conditions and lifestyle choices may make you more likely to have a fall. Your health care provider may recommend:  Regular vision checks. Poor vision and conditions such as cataracts can make you more likely to have a fall. If you wear glasses, make sure to get your prescription updated if your vision changes.  Medicine review. Work with your health care provider to regularly review all of the medicines you are taking, including over-the-counter medicines. Ask your health care provider about any side effects that may make you more likely to have a fall. Tell your health care provider if any medicines that you take make you feel dizzy or sleepy.  Osteoporosis screening. Osteoporosis is a condition that causes the bones to get weaker. This can make the bones weak and cause them to break more easily.  Blood pressure screening. Blood pressure changes and medicines to control blood pressure can make you feel  dizzy.  Strength and balance checks. Your health care provider may recommend certain tests to check your strength and balance while standing, walking, or changing positions.  Foot health exam. Foot pain and numbness, as well as not wearing proper footwear, can make you more likely to have a fall.  Depression screening. You may be more likely to have a fall if you have a fear of falling, feel emotionally low, or feel unable to do activities that you used to do.  Alcohol use screening. Using too much alcohol can affect your balance and may make you more likely to have a fall. What actions can I take to lower my risk of falls? General instructions  Talk with your health care provider about your risks for falling. Tell your health care provider if: ? You fall. Be sure to tell your health care provider about all falls, even ones that seem minor. ? You feel dizzy, sleepy, or off-balance.  Take over-the-counter and prescription medicines only as told by your health care provider. These include any supplements.  Eat a healthy diet and maintain a healthy weight. A healthy diet includes low-fat dairy products, low-fat (lean) meats, and fiber from whole grains, beans, and lots of fruits and vegetables. Home safety  Remove any tripping hazards, such as rugs, cords, and clutter.  Install safety equipment such as grab bars in bathrooms and safety rails on stairs.  Keep rooms and walkways well-lit. Activity   Follow a regular exercise program to stay fit. This will help you maintain your balance. Ask  your health care provider what types of exercise are appropriate for you.  If you need a cane or walker, use it as recommended by your health care provider.  Wear supportive shoes that have nonskid soles. Lifestyle  Do not drink alcohol if your health care provider tells you not to drink.  If you drink alcohol, limit how much you have: ? 0-1 drink a day for women. ? 0-2 drinks a day for  men.  Be aware of how much alcohol is in your drink. In the [place] one drink equals one typical bottle of beer (12 oz), one-half glass of wine (5 oz), or one shot of hard liquor (1 oz).  Do not use any products that contain nicotine or tobacco, such as cigarettes and e-cigarettes. If you need help quitting, ask your health care provider. Summary  Having a healthy lifestyle and getting preventive care can help to protect your health and wellness after age 62.  Screening and testing are the best way to find a health problem early and help you avoid having a fall. Early diagnosis and treatment give you the best chance for managing medical conditions that are more common for people who are older than age 6.  Falls are a major cause of broken bones and head injuries in people who are older than age 87. Take precautions to prevent a fall at home.  Work with your health care provider to learn what changes you can make to improve your health and wellness and to prevent falls. This information is not intended to replace advice given to you by your health care provider. Make sure you discuss any questions you have with your health care provider. Document Revised: 08/02/2018 Document Reviewed: 02/22/2017 Elsevier Patient Education  2020 [employer].

## 2020-02-08 ENCOUNTER — Other Ambulatory Visit: Payer: Self-pay

## 2020-02-08 ENCOUNTER — Ambulatory Visit: Payer: Medicare PPO | Attending: Internal Medicine

## 2020-02-08 DIAGNOSIS — Z23 Encounter for immunization: Secondary | ICD-10-CM

## 2020-02-08 NOTE — Progress Notes (Signed)
   Covid-19 Vaccination Clinic  Name:  KRIYA WESTRA    MRN: [id number] DOB: 09-11-1950  02/08/2020  Ms. Prestridge was observed post Covid-19 immunization for 15 minutes without incident. She was provided with Vaccine Information Sheet and instruction to access the V-Safe system.   Ms. Mustin was instructed to call 911 with any severe reactions post vaccine: Marland Kitchen Difficulty breathing  . Swelling of face and throat  . A fast heartbeat  . A bad rash all over body  . Dizziness and weakness

## 2020-02-12 DIAGNOSIS — H5203 Hypermetropia, bilateral: Secondary | ICD-10-CM | POA: Diagnosis not present

## 2020-02-12 DIAGNOSIS — H524 Presbyopia: Secondary | ICD-10-CM | POA: Diagnosis not present

## 2020-02-12 DIAGNOSIS — H401131 Primary open-angle glaucoma, bilateral, mild stage: Secondary | ICD-10-CM | POA: Diagnosis not present

## 2020-05-06 DIAGNOSIS — Z1231 Encounter for screening mammogram for malignant neoplasm of breast: Secondary | ICD-10-CM | POA: Diagnosis not present

## 2020-05-19 DIAGNOSIS — N951 Menopausal and female climacteric states: Secondary | ICD-10-CM | POA: Diagnosis not present

## 2020-05-19 DIAGNOSIS — R635 Abnormal weight gain: Secondary | ICD-10-CM | POA: Diagnosis not present

## 2020-05-19 DIAGNOSIS — E559 Vitamin D deficiency, unspecified: Secondary | ICD-10-CM | POA: Diagnosis not present

## 2020-05-19 DIAGNOSIS — Z6826 Body mass index (BMI) 26.0-26.9, adult: Secondary | ICD-10-CM | POA: Diagnosis not present

## 2020-05-19 DIAGNOSIS — G479 Sleep disorder, unspecified: Secondary | ICD-10-CM | POA: Diagnosis not present

## 2020-05-19 DIAGNOSIS — R232 Flushing: Secondary | ICD-10-CM | POA: Diagnosis not present

## 2020-05-19 DIAGNOSIS — E039 Hypothyroidism, unspecified: Secondary | ICD-10-CM | POA: Diagnosis not present

## 2020-06-02 DIAGNOSIS — Z6825 Body mass index (BMI) 25.0-25.9, adult: Secondary | ICD-10-CM | POA: Diagnosis not present

## 2020-06-02 DIAGNOSIS — L680 Hirsutism: Secondary | ICD-10-CM | POA: Diagnosis not present

## 2020-06-02 DIAGNOSIS — G479 Sleep disorder, unspecified: Secondary | ICD-10-CM | POA: Diagnosis not present

## 2020-06-02 DIAGNOSIS — E039 Hypothyroidism, unspecified: Secondary | ICD-10-CM | POA: Diagnosis not present

## 2020-06-02 DIAGNOSIS — N951 Menopausal and female climacteric states: Secondary | ICD-10-CM | POA: Diagnosis not present

## 2020-06-26 DIAGNOSIS — E559 Vitamin D deficiency, unspecified: Secondary | ICD-10-CM | POA: Diagnosis not present

## 2020-06-26 DIAGNOSIS — Z79899 Other long term (current) drug therapy: Secondary | ICD-10-CM | POA: Diagnosis not present

## 2020-06-26 DIAGNOSIS — E785 Hyperlipidemia, unspecified: Secondary | ICD-10-CM | POA: Diagnosis not present

## 2020-06-30 DIAGNOSIS — F330 Major depressive disorder, recurrent, mild: Secondary | ICD-10-CM | POA: Diagnosis not present

## 2020-06-30 DIAGNOSIS — E785 Hyperlipidemia, unspecified: Secondary | ICD-10-CM | POA: Diagnosis not present

## 2020-06-30 DIAGNOSIS — R7301 Impaired fasting glucose: Secondary | ICD-10-CM | POA: Diagnosis not present

## 2020-06-30 DIAGNOSIS — Z0000 Encounter for general adult medical examination without abnormal findings: Secondary | ICD-10-CM | POA: Diagnosis not present

## 2020-06-30 DIAGNOSIS — K589 Irritable bowel syndrome without diarrhea: Secondary | ICD-10-CM | POA: Diagnosis not present

## 2020-06-30 DIAGNOSIS — Z23 Encounter for immunization: Secondary | ICD-10-CM | POA: Diagnosis not present

## 2020-06-30 DIAGNOSIS — Z79899 Other long term (current) drug therapy: Secondary | ICD-10-CM | POA: Diagnosis not present

## 2020-06-30 DIAGNOSIS — G4700 Insomnia, unspecified: Secondary | ICD-10-CM | POA: Diagnosis not present

## 2020-06-30 DIAGNOSIS — E559 Vitamin D deficiency, unspecified: Secondary | ICD-10-CM | POA: Diagnosis not present

## 2020-09-14 DIAGNOSIS — M1711 Unilateral primary osteoarthritis, right knee: Secondary | ICD-10-CM | POA: Diagnosis not present

## 2020-09-17 DIAGNOSIS — H2513 Age-related nuclear cataract, bilateral: Secondary | ICD-10-CM | POA: Diagnosis not present

## 2020-09-17 DIAGNOSIS — H524 Presbyopia: Secondary | ICD-10-CM | POA: Diagnosis not present

## 2020-09-17 DIAGNOSIS — H401111 Primary open-angle glaucoma, right eye, mild stage: Secondary | ICD-10-CM | POA: Diagnosis not present

## 2020-09-17 DIAGNOSIS — H5203 Hypermetropia, bilateral: Secondary | ICD-10-CM | POA: Diagnosis not present

## 2020-09-17 DIAGNOSIS — H401121 Primary open-angle glaucoma, left eye, mild stage: Secondary | ICD-10-CM | POA: Diagnosis not present

## 2020-09-22 DIAGNOSIS — E039 Hypothyroidism, unspecified: Secondary | ICD-10-CM | POA: Diagnosis not present

## 2020-09-22 DIAGNOSIS — Z6825 Body mass index (BMI) 25.0-25.9, adult: Secondary | ICD-10-CM | POA: Diagnosis not present

## 2020-10-05 DIAGNOSIS — Z6825 Body mass index (BMI) 25.0-25.9, adult: Secondary | ICD-10-CM | POA: Diagnosis not present

## 2020-10-05 DIAGNOSIS — E559 Vitamin D deficiency, unspecified: Secondary | ICD-10-CM | POA: Diagnosis not present

## 2020-10-21 DIAGNOSIS — M25561 Pain in right knee: Secondary | ICD-10-CM | POA: Diagnosis not present

## 2020-10-21 DIAGNOSIS — M1711 Unilateral primary osteoarthritis, right knee: Secondary | ICD-10-CM | POA: Diagnosis not present

## 2020-12-02 ENCOUNTER — Ambulatory Visit: Payer: Medicare PPO | Admitting: Nurse Practitioner

## 2021-01-05 DIAGNOSIS — Z818 Family history of other mental and behavioral disorders: Secondary | ICD-10-CM | POA: Diagnosis not present

## 2021-01-05 DIAGNOSIS — H409 Unspecified glaucoma: Secondary | ICD-10-CM | POA: Diagnosis not present

## 2021-01-05 DIAGNOSIS — R030 Elevated blood-pressure reading, without diagnosis of hypertension: Secondary | ICD-10-CM | POA: Diagnosis not present

## 2021-01-05 DIAGNOSIS — F3342 Major depressive disorder, recurrent, in full remission: Secondary | ICD-10-CM | POA: Diagnosis not present

## 2021-01-05 DIAGNOSIS — Z6827 Body mass index (BMI) 27.0-27.9, adult: Secondary | ICD-10-CM | POA: Diagnosis not present

## 2021-01-05 DIAGNOSIS — F419 Anxiety disorder, unspecified: Secondary | ICD-10-CM | POA: Diagnosis not present

## 2021-01-05 DIAGNOSIS — E663 Overweight: Secondary | ICD-10-CM | POA: Diagnosis not present

## 2021-01-19 DIAGNOSIS — Z23 Encounter for immunization: Secondary | ICD-10-CM | POA: Diagnosis not present

## 2021-01-19 DIAGNOSIS — F322 Major depressive disorder, single episode, severe without psychotic features: Secondary | ICD-10-CM | POA: Diagnosis not present

## 2021-01-19 DIAGNOSIS — G4700 Insomnia, unspecified: Secondary | ICD-10-CM | POA: Diagnosis not present

## 2021-02-11 DIAGNOSIS — M25561 Pain in right knee: Secondary | ICD-10-CM | POA: Diagnosis not present

## 2021-02-11 DIAGNOSIS — M1711 Unilateral primary osteoarthritis, right knee: Secondary | ICD-10-CM | POA: Diagnosis not present

## 2021-03-01 ENCOUNTER — Ambulatory Visit: Payer: Medicare PPO | Admitting: Nurse Practitioner

## 2021-03-05 DIAGNOSIS — M1711 Unilateral primary osteoarthritis, right knee: Secondary | ICD-10-CM | POA: Diagnosis not present

## 2021-03-15 DIAGNOSIS — M25561 Pain in right knee: Secondary | ICD-10-CM | POA: Diagnosis not present

## 2021-03-15 DIAGNOSIS — M1711 Unilateral primary osteoarthritis, right knee: Secondary | ICD-10-CM | POA: Diagnosis not present

## 2021-03-24 DIAGNOSIS — M1711 Unilateral primary osteoarthritis, right knee: Secondary | ICD-10-CM | POA: Diagnosis not present

## 2021-03-24 DIAGNOSIS — M25561 Pain in right knee: Secondary | ICD-10-CM | POA: Diagnosis not present

## 2021-03-25 DIAGNOSIS — H40013 Open angle with borderline findings, low risk, bilateral: Secondary | ICD-10-CM | POA: Diagnosis not present

## 2021-03-25 DIAGNOSIS — H40053 Ocular hypertension, bilateral: Secondary | ICD-10-CM | POA: Diagnosis not present

## 2021-03-25 DIAGNOSIS — H5203 Hypermetropia, bilateral: Secondary | ICD-10-CM | POA: Diagnosis not present

## 2021-03-25 DIAGNOSIS — H524 Presbyopia: Secondary | ICD-10-CM | POA: Diagnosis not present

## 2021-04-29 DIAGNOSIS — L649 Androgenic alopecia, unspecified: Secondary | ICD-10-CM | POA: Diagnosis not present

## 2021-04-29 DIAGNOSIS — L650 Telogen effluvium: Secondary | ICD-10-CM | POA: Diagnosis not present

## 2021-06-02 DIAGNOSIS — D2262 Melanocytic nevi of left upper limb, including shoulder: Secondary | ICD-10-CM | POA: Diagnosis not present

## 2021-06-02 DIAGNOSIS — L720 Epidermal cyst: Secondary | ICD-10-CM | POA: Diagnosis not present

## 2021-06-02 DIAGNOSIS — D2239 Melanocytic nevi of other parts of face: Secondary | ICD-10-CM | POA: Diagnosis not present

## 2021-06-02 DIAGNOSIS — L814 Other melanin hyperpigmentation: Secondary | ICD-10-CM | POA: Diagnosis not present

## 2021-06-02 DIAGNOSIS — L821 Other seborrheic keratosis: Secondary | ICD-10-CM | POA: Diagnosis not present

## 2021-06-07 DIAGNOSIS — R32 Unspecified urinary incontinence: Secondary | ICD-10-CM | POA: Diagnosis not present

## 2021-06-07 DIAGNOSIS — G8929 Other chronic pain: Secondary | ICD-10-CM | POA: Diagnosis not present

## 2021-06-07 DIAGNOSIS — M8580 Other specified disorders of bone density and structure, unspecified site: Secondary | ICD-10-CM | POA: Diagnosis not present

## 2021-06-07 DIAGNOSIS — H409 Unspecified glaucoma: Secondary | ICD-10-CM | POA: Diagnosis not present

## 2021-06-07 DIAGNOSIS — F419 Anxiety disorder, unspecified: Secondary | ICD-10-CM | POA: Diagnosis not present

## 2021-06-07 DIAGNOSIS — F324 Major depressive disorder, single episode, in partial remission: Secondary | ICD-10-CM | POA: Diagnosis not present

## 2021-06-07 DIAGNOSIS — M1990 Unspecified osteoarthritis, unspecified site: Secondary | ICD-10-CM | POA: Diagnosis not present

## 2021-06-07 DIAGNOSIS — G4700 Insomnia, unspecified: Secondary | ICD-10-CM | POA: Diagnosis not present

## 2021-06-07 DIAGNOSIS — R030 Elevated blood-pressure reading, without diagnosis of hypertension: Secondary | ICD-10-CM | POA: Diagnosis not present

## 2021-06-09 ENCOUNTER — Ambulatory Visit: Payer: Medicare PPO | Admitting: Nurse Practitioner

## 2021-06-16 ENCOUNTER — Other Ambulatory Visit: Payer: Self-pay

## 2021-06-16 ENCOUNTER — Encounter: Payer: Self-pay | Admitting: Nurse Practitioner

## 2021-06-16 ENCOUNTER — Ambulatory Visit (INDEPENDENT_AMBULATORY_CARE_PROVIDER_SITE_OTHER): Payer: Medicare PPO | Admitting: Nurse Practitioner

## 2021-06-16 VITALS — BP 126/80 | Ht 64.0 in | Wt 146.0 lb

## 2021-06-16 DIAGNOSIS — Z01419 Encounter for gynecological examination (general) (routine) without abnormal findings: Secondary | ICD-10-CM

## 2021-06-16 DIAGNOSIS — R351 Nocturia: Secondary | ICD-10-CM

## 2021-06-16 DIAGNOSIS — Z780 Asymptomatic menopausal state: Secondary | ICD-10-CM

## 2021-06-16 DIAGNOSIS — M8589 Other specified disorders of bone density and structure, multiple sites: Secondary | ICD-10-CM

## 2021-06-16 NOTE — Progress Notes (Signed)
Terri Henderson 02/10/1951 [id number]   History:  70 y.o. G0 presents for breast and pelvic exam. Postmenopausal - on HRT, no bleeding. No longer using testosterone pellets and Prometrium. Normal pap history. History of IBS, vitamin D deficiency, depression, endometriosis, osteopenia. Gets up 2-3 times per night to urinate, occasional urge incontinence. Has been evaluated by urology with normal findings.    Gynecologic History No LMP recorded. Patient is postmenopausal.   Contraception/Family planning: post menopausal status Sexually active: No  Health maintenance Last Pap: 11/03/2015. Results were: Normal Last mammogram: 05/06/2020. Results were: Normal Colonoscopy: 10/24/2013. Results were: Normal, 10-year recall Dexa: 06/14/2018. Results were: T-score -1.2 FRAX 8.6% / 0.8%. Osteopenic at bilateral hip  Past medical history, past surgical history, family history and social history were all reviewed and documented in the EPIC chart. Married. 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   ROS:  A ROS was performed and pertinent positives and negatives are included.  Exam:  Vitals:   06/16/21 1331  BP: 126/80  Weight: 146 lb (66.2 kg)  Height: 5\' 4"  (1.626 m)    Body mass index is 25.06 kg/m.  General appearance:  Normal Thyroid:  Symmetrical, normal in size, without palpable masses or nodularity. Respiratory  Auscultation:  Clear without wheezing or rhonchi Cardiovascular  Auscultation:  Regular rate, without rubs, murmurs or gallops  Edema/varicosities:  Not grossly evident Abdominal  Soft,nontender, without masses, guarding or rebound.  Liver/spleen:  No organomegaly noted  Hernia:  None appreciated  Skin  Inspection:  Grossly normal   Breasts: Examined lying and sitting.   Right: Without masses, retractions, discharge or axillary adenopathy.   Left: Without masses, retractions, discharge or axillary adenopathy. Genitourinary   Inguinal/mons:  Normal  without inguinal adenopathy  External genitalia:  Normal appearing vulva with no masses, tenderness, or lesions  BUS/Urethra/Skene's glands:  Normal  Vagina:  Normal appearing with normal color and discharge, no lesions. Atrophic changes  Cervix:  Normal appearing without discharge or lesions  Uterus:  Normal in size, shape and contour.  Midline and mobile, nontender  Adnexa/parametria:     Rt: Normal in size, without masses or tenderness.   Lt: Normal in size, without masses or tenderness.  Anus and perineum: Normal  Digital rectal exam: Normal sphincter tone without palpated masses or tenderness  Patient informed chaperone available to be present for breast and pelvic exam. Patient has requested no chaperone to be present. Patient has been advised what will be completed during breast and pelvic exam.   Assessment/Plan:  71 y.o. MWF G0 for breast and pelvic exam.   Well female exam with routine gynecological exam  - Education provided on SBEs, importance of preventative screenings, current guidelines, high calcium diet, regular exercise, and multivitamin daily. Labs with PCP.   Postmenopausal - Plan: DG Bone Density. No HRT, no bleeding. Stopped testosterone pellets and Prometrium last year.  Osteopenia of multiple sites - Plan: DG Bone Density. T-score -1.2 in 2020. Continue Vitamin D + Calcium supplement and regular exercise. She walks most days and plans to restart yoga.   Nocturia more than twice per night - This is not new for her. Recommend avoiding liquids 2-3 hours before bed and voiding more often/double voiding for management of urge incontinence. This mostly occurs if she is driving or not near a restroom.   Screening for cervical cancer - Normal Pap history. No longer screening per guidelines.   Screening for breast cancer - Normal mammogram history.  Continue annual screenings.  Normal breast exam today. Scheduled next month for mammogram.   Screening for colon cancer -  Colonoscopy 2015. Will repeat at 10-year interval per GI's recommendation.   Follow-up in 2 years for breast and pelvic exam.     Olivia Mackie Shannon [facility], 1:46 PM 06/16/2021

## 2021-07-27 ENCOUNTER — Ambulatory Visit (INDEPENDENT_AMBULATORY_CARE_PROVIDER_SITE_OTHER): Payer: Medicare PPO

## 2021-07-27 ENCOUNTER — Other Ambulatory Visit: Payer: Self-pay | Admitting: Nurse Practitioner

## 2021-07-27 DIAGNOSIS — M8589 Other specified disorders of bone density and structure, multiple sites: Secondary | ICD-10-CM

## 2021-07-27 DIAGNOSIS — Z780 Asymptomatic menopausal state: Secondary | ICD-10-CM

## 2021-08-10 DIAGNOSIS — F322 Major depressive disorder, single episode, severe without psychotic features: Secondary | ICD-10-CM | POA: Diagnosis not present

## 2021-08-10 DIAGNOSIS — F419 Anxiety disorder, unspecified: Secondary | ICD-10-CM | POA: Diagnosis not present

## 2021-08-10 DIAGNOSIS — G4700 Insomnia, unspecified: Secondary | ICD-10-CM | POA: Diagnosis not present

## 2021-08-10 DIAGNOSIS — Z0000 Encounter for general adult medical examination without abnormal findings: Secondary | ICD-10-CM | POA: Diagnosis not present

## 2021-08-10 DIAGNOSIS — R7301 Impaired fasting glucose: Secondary | ICD-10-CM | POA: Diagnosis not present

## 2021-08-10 DIAGNOSIS — E559 Vitamin D deficiency, unspecified: Secondary | ICD-10-CM | POA: Diagnosis not present

## 2021-08-10 DIAGNOSIS — Z79899 Other long term (current) drug therapy: Secondary | ICD-10-CM | POA: Diagnosis not present

## 2021-08-27 DIAGNOSIS — H40012 Open angle with borderline findings, low risk, left eye: Secondary | ICD-10-CM | POA: Diagnosis not present

## 2021-08-27 DIAGNOSIS — H401121 Primary open-angle glaucoma, left eye, mild stage: Secondary | ICD-10-CM | POA: Diagnosis not present

## 2021-08-27 DIAGNOSIS — H401111 Primary open-angle glaucoma, right eye, mild stage: Secondary | ICD-10-CM | POA: Diagnosis not present

## 2021-08-27 DIAGNOSIS — H5203 Hypermetropia, bilateral: Secondary | ICD-10-CM | POA: Diagnosis not present

## 2021-08-27 DIAGNOSIS — H524 Presbyopia: Secondary | ICD-10-CM | POA: Diagnosis not present

## 2021-08-27 DIAGNOSIS — H40052 Ocular hypertension, left eye: Secondary | ICD-10-CM | POA: Diagnosis not present

## 2021-08-27 DIAGNOSIS — H4010X1 Unspecified open-angle glaucoma, mild stage: Secondary | ICD-10-CM | POA: Diagnosis not present

## 2021-10-04 DIAGNOSIS — Z1231 Encounter for screening mammogram for malignant neoplasm of breast: Secondary | ICD-10-CM | POA: Diagnosis not present

## 2021-10-05 ENCOUNTER — Encounter: Payer: Self-pay | Admitting: Nurse Practitioner

## 2021-10-28 DIAGNOSIS — L570 Actinic keratosis: Secondary | ICD-10-CM | POA: Diagnosis not present

## 2021-10-28 DIAGNOSIS — L738 Other specified follicular disorders: Secondary | ICD-10-CM | POA: Diagnosis not present

## 2021-10-28 DIAGNOSIS — L649 Androgenic alopecia, unspecified: Secondary | ICD-10-CM | POA: Diagnosis not present

## 2021-10-28 DIAGNOSIS — L718 Other rosacea: Secondary | ICD-10-CM | POA: Diagnosis not present

## 2021-12-17 DIAGNOSIS — L718 Other rosacea: Secondary | ICD-10-CM | POA: Diagnosis not present

## 2022-01-25 DIAGNOSIS — Z23 Encounter for immunization: Secondary | ICD-10-CM | POA: Diagnosis not present

## 2022-02-22 DIAGNOSIS — F330 Major depressive disorder, recurrent, mild: Secondary | ICD-10-CM | POA: Diagnosis not present

## 2022-02-22 DIAGNOSIS — F419 Anxiety disorder, unspecified: Secondary | ICD-10-CM | POA: Diagnosis not present

## 2022-03-31 DIAGNOSIS — U071 COVID-19: Secondary | ICD-10-CM | POA: Diagnosis not present

## 2022-04-12 DIAGNOSIS — H2513 Age-related nuclear cataract, bilateral: Secondary | ICD-10-CM | POA: Diagnosis not present

## 2022-04-12 DIAGNOSIS — H524 Presbyopia: Secondary | ICD-10-CM | POA: Diagnosis not present

## 2022-04-12 DIAGNOSIS — H401111 Primary open-angle glaucoma, right eye, mild stage: Secondary | ICD-10-CM | POA: Diagnosis not present

## 2022-04-12 DIAGNOSIS — H5203 Hypermetropia, bilateral: Secondary | ICD-10-CM | POA: Diagnosis not present

## 2022-04-12 DIAGNOSIS — H401121 Primary open-angle glaucoma, left eye, mild stage: Secondary | ICD-10-CM | POA: Diagnosis not present

## 2022-04-12 DIAGNOSIS — H401131 Primary open-angle glaucoma, bilateral, mild stage: Secondary | ICD-10-CM | POA: Diagnosis not present

## 2022-04-12 DIAGNOSIS — H53143 Visual discomfort, bilateral: Secondary | ICD-10-CM | POA: Diagnosis not present

## 2022-08-17 DIAGNOSIS — R350 Frequency of micturition: Secondary | ICD-10-CM | POA: Diagnosis not present

## 2022-08-17 DIAGNOSIS — E559 Vitamin D deficiency, unspecified: Secondary | ICD-10-CM | POA: Diagnosis not present

## 2022-08-17 DIAGNOSIS — Z0000 Encounter for general adult medical examination without abnormal findings: Secondary | ICD-10-CM | POA: Diagnosis not present

## 2022-08-17 DIAGNOSIS — F330 Major depressive disorder, recurrent, mild: Secondary | ICD-10-CM | POA: Diagnosis not present

## 2022-08-17 DIAGNOSIS — Z79899 Other long term (current) drug therapy: Secondary | ICD-10-CM | POA: Diagnosis not present

## 2022-08-17 DIAGNOSIS — R7301 Impaired fasting glucose: Secondary | ICD-10-CM | POA: Diagnosis not present

## 2022-11-18 DIAGNOSIS — Z1231 Encounter for screening mammogram for malignant neoplasm of breast: Secondary | ICD-10-CM | POA: Diagnosis not present

## 2022-12-05 DIAGNOSIS — U071 COVID-19: Secondary | ICD-10-CM | POA: Diagnosis not present

## 2023-01-06 DIAGNOSIS — E785 Hyperlipidemia, unspecified: Secondary | ICD-10-CM | POA: Diagnosis not present

## 2023-01-06 DIAGNOSIS — Z8249 Family history of ischemic heart disease and other diseases of the circulatory system: Secondary | ICD-10-CM | POA: Diagnosis not present

## 2023-01-06 DIAGNOSIS — M8580 Other specified disorders of bone density and structure, unspecified site: Secondary | ICD-10-CM | POA: Diagnosis not present

## 2023-01-06 DIAGNOSIS — R030 Elevated blood-pressure reading, without diagnosis of hypertension: Secondary | ICD-10-CM | POA: Diagnosis not present

## 2023-01-06 DIAGNOSIS — M1990 Unspecified osteoarthritis, unspecified site: Secondary | ICD-10-CM | POA: Diagnosis not present

## 2023-01-06 DIAGNOSIS — Z823 Family history of stroke: Secondary | ICD-10-CM | POA: Diagnosis not present

## 2023-01-06 DIAGNOSIS — F419 Anxiety disorder, unspecified: Secondary | ICD-10-CM | POA: Diagnosis not present

## 2023-01-06 DIAGNOSIS — F324 Major depressive disorder, single episode, in partial remission: Secondary | ICD-10-CM | POA: Diagnosis not present

## 2023-01-06 DIAGNOSIS — L639 Alopecia areata, unspecified: Secondary | ICD-10-CM | POA: Diagnosis not present

## 2023-01-09 DIAGNOSIS — H401131 Primary open-angle glaucoma, bilateral, mild stage: Secondary | ICD-10-CM | POA: Diagnosis not present

## 2023-01-11 DIAGNOSIS — Z23 Encounter for immunization: Secondary | ICD-10-CM | POA: Diagnosis not present

## 2023-05-12 DIAGNOSIS — F330 Major depressive disorder, recurrent, mild: Secondary | ICD-10-CM | POA: Diagnosis not present

## 2023-05-12 DIAGNOSIS — Z23 Encounter for immunization: Secondary | ICD-10-CM | POA: Diagnosis not present

## 2023-05-12 DIAGNOSIS — G4700 Insomnia, unspecified: Secondary | ICD-10-CM | POA: Diagnosis not present

## 2023-05-25 DIAGNOSIS — H53143 Visual discomfort, bilateral: Secondary | ICD-10-CM | POA: Diagnosis not present

## 2023-05-25 DIAGNOSIS — H524 Presbyopia: Secondary | ICD-10-CM | POA: Diagnosis not present

## 2023-05-25 DIAGNOSIS — H52223 Regular astigmatism, bilateral: Secondary | ICD-10-CM | POA: Diagnosis not present

## 2023-05-25 DIAGNOSIS — H2513 Age-related nuclear cataract, bilateral: Secondary | ICD-10-CM | POA: Diagnosis not present

## 2023-05-25 DIAGNOSIS — H5203 Hypermetropia, bilateral: Secondary | ICD-10-CM | POA: Diagnosis not present

## 2023-05-25 DIAGNOSIS — H401131 Primary open-angle glaucoma, bilateral, mild stage: Secondary | ICD-10-CM | POA: Diagnosis not present

## 2023-09-02 DIAGNOSIS — L255 Unspecified contact dermatitis due to plants, except food: Secondary | ICD-10-CM | POA: Diagnosis not present

## 2023-09-03 ENCOUNTER — Encounter (HOSPITAL_COMMUNITY): Payer: Self-pay | Admitting: *Deleted

## 2023-09-03 ENCOUNTER — Ambulatory Visit (HOSPITAL_COMMUNITY): Admission: EM | Admit: 2023-09-03 | Discharge: 2023-09-03 | Disposition: A | Discharge status: Home / Self Care

## 2023-09-03 ENCOUNTER — Other Ambulatory Visit: Payer: Self-pay

## 2023-09-03 DIAGNOSIS — T50901A Poisoning by unspecified drugs, medicaments and biological substances, accidental (unintentional), initial encounter: Secondary | ICD-10-CM

## 2023-09-03 NOTE — ED Provider Notes (Signed)
 [facility]  Note:  This document was prepared using Dragon voice recognition software and may include unintentional dictation errors.  MRN: [id number] DOB: 11-Dec-1950  Subjective:   Terri Henderson is a 73 y.o. female presenting for evaluation after accidentally taking 4 pills of her husband's memantine for Alzheimer's disease.  Patient reports that she picked up his prescription as well as a prescription for prednisone for herself and accidentally took his medication instead of her prednisone.  Patient denies any suicidal ideation.  Patient was concerned that she may get some side effects, however she is not experiencing any GI upset or headache after taking medication at 8 AM this morning.  Patient denies any other medical concern at this time.  No current facility-administered medications for this encounter.  Current Outpatient Medications:    ALPRAZolam (XANAX) 0.25 MG tablet, Take 0.25 mg by mouth at bedtime as needed for anxiety., Disp: , Rfl:    BIOTIN PO, Take by mouth., Disp: , Rfl:    buPROPion (WELLBUTRIN SR) 150 MG 12 hr tablet, Take 150 mg by mouth 2 (two) times daily., Disp: , Rfl:    Cholecalciferol (VITAMIN D  PO), Take by mouth., Disp: , Rfl:    FLUoxetine (PROZAC) 20 MG tablet, Take 20 mg by mouth daily., Disp: , Rfl:    CALCIUM PO, Take by mouth., Disp: , Rfl:    hydroquinone 2 % cream, Apply topically 2 (two) times daily., Disp: , Rfl:    NONFORMULARY OR COMPOUNDED ITEM, Finasteride/Minoxidil solution, Disp: , Rfl:    zolpidem (AMBIEN) 5 MG tablet, Take 5 mg by mouth at bedtime as needed for sleep. (Patient not taking: Reported on 06/16/2021), Disp: , Rfl:    Allergies  Allergen Reactions   Compazine [Prochlorperazine Edisylate] Other (See Comments)    "locked jaw"   Mirvaso [Brimonidine Tartrate] Swelling   Sulfa Antibiotics Rash    Past Medical History:  Diagnosis Date   Anxiety    Endometriosis    IBS (irritable bowel syndrome)    Rosacea       Past Surgical History:  Procedure Laterality Date   COLONOSCOPY     LAPAROSCOPIC OVARIAN CYSTECTOMY     PELVIC LAPAROSCOPY      Family History  Problem Relation Age of Onset   Hypertension Mother    Osteoporosis Mother    Cancer Father        lung smoker   Cancer Maternal Grandfather        brain tumor   Colon cancer Neg Hx     Social History   Tobacco Use   Smoking status: Never   Smokeless tobacco: Never  Substance Use Topics   Alcohol use: Yes    Alcohol/week: 7.0 standard drinks of alcohol    Types: 7 Glasses of wine per week    Comment: 6-7 glasses wine per week per patient/RM   Drug use: No    ROS Refer to HPI for ROS details.  Objective:   Vitals: BP 116/75   Pulse 74   Temp 97.8 F (36.6 C)   Resp 18   SpO2 95%   Physical Exam Vitals and nursing note reviewed.  Constitutional:      General: She is not in acute distress.    Appearance: She is well-developed. She is not ill-appearing or toxic-appearing.  HENT:     Head: Normocephalic and atraumatic.     Mouth/Throat:     Mouth: Mucous membranes are moist.  Eyes:     General:  Right eye: No discharge.        Left eye: No discharge.     Extraocular Movements: Extraocular movements intact.     Conjunctiva/sclera: Conjunctivae normal.  Cardiovascular:     Rate and Rhythm: Normal rate and regular rhythm.     Heart sounds: Normal heart sounds. No murmur heard. Pulmonary:     Effort: Pulmonary effort is normal. No respiratory distress.     Breath sounds: Normal breath sounds. No stridor. No wheezing, rhonchi or rales.  Abdominal:     General: There is no distension.     Palpations: Abdomen is soft.     Tenderness: There is no abdominal tenderness. There is no right CVA tenderness, left CVA tenderness, guarding or rebound.  Skin:    General: Skin is warm and dry.  Neurological:     General: No focal deficit present.     Mental Status: She is alert and oriented to person, place, and  time.     Cranial Nerves: No cranial nerve deficit.     Sensory: No sensory deficit.     Motor: No weakness.     Coordination: Coordination normal.     Gait: Gait normal.  Psychiatric:        Mood and Affect: Mood normal.        Behavior: Behavior normal.        Thought Content: Thought content normal.        Judgment: Judgment normal.     Procedures  No results found for this or any previous visit (from the past 24 hours).  No results found.   Assessment and Plan :     Discharge Instructions       1. Accidental overdose, initial encounter (Primary) - Common associated side effects with memantine are nausea/vomiting, diarrhea, abdominal pain, headache, weakness, dizziness.  If you experience any of the symptoms or other abnormal symptoms follow-up in ER for further evaluation and management. - No need to follow-up in ER at this time due to lack of side effect symptoms after taking medication.    Rylei Codispoti B Sylus Stgermain   Juandavid Dallman, [place], [place] 09/03/23 1128

## 2023-09-03 NOTE — ED Triage Notes (Signed)
 PT reports she took her Husband's new memory medication by accident this AM. Memantine HCL  10mg . Pt took 4 of the tabs.. Pt concerned about any possible side effects.

## 2023-09-03 NOTE — Discharge Instructions (Addendum)
  1. Accidental overdose, initial encounter (Primary) - Common associated side effects with memantine are nausea/vomiting, diarrhea, abdominal pain, headache, weakness, dizziness.  If you experience any of the symptoms or other abnormal symptoms follow-up in ER for further evaluation and management. - No need to follow-up in ER at this time due to lack of side effect symptoms after taking medication.

## 2023-09-03 NOTE — ED Triage Notes (Signed)
 PT DOB and full name confirmed .

## 2023-09-14 DIAGNOSIS — N958 Other specified menopausal and perimenopausal disorders: Secondary | ICD-10-CM | POA: Diagnosis not present

## 2023-09-14 DIAGNOSIS — N3281 Overactive bladder: Secondary | ICD-10-CM | POA: Diagnosis not present

## 2023-09-28 DIAGNOSIS — W57XXXA Bitten or stung by nonvenomous insect and other nonvenomous arthropods, initial encounter: Secondary | ICD-10-CM | POA: Diagnosis not present

## 2023-09-28 DIAGNOSIS — S30860A Insect bite (nonvenomous) of lower back and pelvis, initial encounter: Secondary | ICD-10-CM | POA: Diagnosis not present

## 2023-09-28 DIAGNOSIS — A6920 Lyme disease, unspecified: Secondary | ICD-10-CM | POA: Diagnosis not present

## 2023-10-17 DIAGNOSIS — M8588 Other specified disorders of bone density and structure, other site: Secondary | ICD-10-CM | POA: Diagnosis not present

## 2023-10-17 DIAGNOSIS — E2839 Other primary ovarian failure: Secondary | ICD-10-CM | POA: Diagnosis not present

## 2023-10-24 ENCOUNTER — Encounter: Admitting: Nurse Practitioner

## 2023-10-26 DIAGNOSIS — M1711 Unilateral primary osteoarthritis, right knee: Secondary | ICD-10-CM | POA: Diagnosis not present

## 2023-10-26 DIAGNOSIS — M25561 Pain in right knee: Secondary | ICD-10-CM | POA: Diagnosis not present

## 2023-10-26 DIAGNOSIS — M1712 Unilateral primary osteoarthritis, left knee: Secondary | ICD-10-CM | POA: Diagnosis not present

## 2023-10-26 DIAGNOSIS — M170 Bilateral primary osteoarthritis of knee: Secondary | ICD-10-CM | POA: Diagnosis not present

## 2023-10-31 DIAGNOSIS — E785 Hyperlipidemia, unspecified: Secondary | ICD-10-CM | POA: Diagnosis not present

## 2023-10-31 DIAGNOSIS — R7301 Impaired fasting glucose: Secondary | ICD-10-CM | POA: Diagnosis not present

## 2023-10-31 DIAGNOSIS — Z79899 Other long term (current) drug therapy: Secondary | ICD-10-CM | POA: Diagnosis not present

## 2023-10-31 DIAGNOSIS — G4700 Insomnia, unspecified: Secondary | ICD-10-CM | POA: Diagnosis not present

## 2023-10-31 DIAGNOSIS — E559 Vitamin D deficiency, unspecified: Secondary | ICD-10-CM | POA: Diagnosis not present

## 2023-10-31 DIAGNOSIS — Z0000 Encounter for general adult medical examination without abnormal findings: Secondary | ICD-10-CM | POA: Diagnosis not present

## 2023-10-31 DIAGNOSIS — F331 Major depressive disorder, recurrent, moderate: Secondary | ICD-10-CM | POA: Diagnosis not present

## 2023-11-02 ENCOUNTER — Encounter: Payer: Self-pay | Admitting: Physical Therapy

## 2023-11-02 ENCOUNTER — Ambulatory Visit: Attending: Obstetrics and Gynecology | Admitting: Physical Therapy

## 2023-11-02 ENCOUNTER — Other Ambulatory Visit: Payer: Self-pay

## 2023-11-02 DIAGNOSIS — M6281 Muscle weakness (generalized): Secondary | ICD-10-CM | POA: Insufficient documentation

## 2023-11-02 DIAGNOSIS — R293 Abnormal posture: Secondary | ICD-10-CM | POA: Insufficient documentation

## 2023-11-02 DIAGNOSIS — R279 Unspecified lack of coordination: Secondary | ICD-10-CM | POA: Insufficient documentation

## 2023-11-02 NOTE — Therapy (Signed)
 OUTPATIENT PHYSICAL THERAPY FEMALE PELVIC EVALUATION   Patient Name: Terri Henderson MRN: [id number] DOB:[birth year], 73 y.o., female Today's Date: 11/02/2023  END OF SESSION:  PT End of Session - 11/02/23 1441     Visit Number 1    Number of Visits 8    Date for PT Re-Evaluation 11/30/23    Authorization Type [employer] MCR    PT Start Time 0200    PT Stop Time 0245    PT Time Calculation (min) 45 min    Activity Tolerance Patient tolerated treatment well    Behavior During Therapy WFL for tasks assessed/performed          Past Medical History:  Diagnosis Date   Anxiety    Endometriosis    IBS (irritable bowel syndrome)    Rosacea    Past Surgical History:  Procedure Laterality Date   COLONOSCOPY     LAPAROSCOPIC OVARIAN CYSTECTOMY     PELVIC LAPAROSCOPY     Patient Active Problem List   Diagnosis Date Noted   Osteopenia after menopause 05/08/2018   Depression 05/08/2018    PCP: Verena Mems, MD  REFERRING PROVIDER: Alvia Dorothyann LABOR, MD   REFERRING DIAG: N32.81 (ICD-10-CM) - Overactive bladder  THERAPY DIAG:  Muscle weakness (generalized)  Unspecified lack of coordination  Abnormal posture  Rationale for Evaluation and Treatment: Rehabilitation  ONSET DATE: 2023  SUBJECTIVE:                                                                                                                                                                                           SUBJECTIVE STATEMENT: Patient reports that someitmes she has increased urinary urgency and cannot make it to the bathroom in time. She has a hard time holding her urine when she is driving. She has estradiol  cream at home.   Fluid intake: Her fluid intake includes 2 to 3 mugs of caffeinated coffee in the morning, 7 to 8 glasses of water throughout the day, and occasional sparkling water in the evening. She also consumes sodas or teas once a week and a glass of wine 4 to 5 times a week.    PAIN:  Are you having pain? No NPRS scale: 0/10  PRECAUTIONS: None  RED FLAGS: None   WEIGHT BEARING RESTRICTIONS: No  FALLS:  Has patient fallen in last 6 months? No  OCCUPATION: mostly retired, works occasionally for [employer] on an as needed basis   ACTIVITY LEVEL : walks, silver sneakers classes or online, tai chi  PLOF: Independent with basic ADLs  PATIENT GOALS: decrease urinary   PERTINENT HISTORY:  Hx: osteopenia after menopause,  ovarian cyst removal, pelvic laparoscopy  Sexual abuse: No  BOWEL MOVEMENT: Pain with bowel movement: No Type of bowel movement:Type (Bristol Stool Scale) 2-3, Frequency daily , Strain no, and Splinting no Fully empty rectum: Yes:   Leakage: No Pads: No Fiber supplement/laxative Yes - takes magnesium   URINATION: Pain with urination: No Fully empty bladder: [place] tries to lean back and go more  Stream: Strong Urgency: Yes  Frequency: within normal limits - gets up 1-3x/night  Leakage: Urge to void, Coughing, Sneezing, and Exercise Pads: No  INTERCOURSE:  Ability to have vaginal penetration No  Pain with intercourse: none DrynessYes   PREGNANCY: Vaginal deliveries 0 C-section deliveries 0  PROLAPSE: None  OBJECTIVE:  Note: Objective measures were completed at Evaluation unless otherwise noted.  PATIENT SURVEYS:  PFIQ-7: 26  COGNITION: Overall cognitive status: Within functional limits for tasks assessed     SENSATION: Light touch: Appears intact  LUMBAR SPECIAL TESTS:  Single leg stance test: Positive  FUNCTIONAL TESTS:  Squat:   GAIT: Assistive device utilized: none Comments: mild trendelenburg gait pattern with ambulation   POSTURE: rounded shoulders, forward head, and flexed trunk   LUMBARAROM/PROM: within normal limits bilaterally   LOWER EXTREMITY ROM: within normal limits for bilateral lower extremities   LOWER EXTREMITY MMT: 4/5 bilateral knees and hips grossly   PALPATION:   General:  no significant tenderness to palpation of bilateral adductors or hip flexors   Pelvic Alignment: within normal limits   Abdominal: upper chest breathing and abdominal bracing at rest, decreased lower rib excursion with inhalation                 External Perineal Exam: minimal dryness present with sufficient clitoral hood mobility, no tenderness to external pelvic floor muscle palpation                             Internal Pelvic Floor: Patient fully consents to today's internal examination. She demonstrates low tone throughout the pelvic floor with weakness present. She is unable to complete a pelvic floor contraction independently in hooklying, but with deep core activation paired with an exhale, she is able to create a small pelvic floor contraction. There is a lack of coordination present in the pelvic floor along with stiffness, most likely due to not using the musculature.   Patient confirms identification and approves PT to assess internal pelvic floor and treatment Yes No emotional/communication barriers or cognitive limitation. Patient is motivated to learn. Patient understands and agrees with treatment goals and plan. PT explains patient will be examined in standing, sitting, and lying down to see how their muscles and joints work. When they are ready, they will be asked to remove their underwear so PT can examine their perineum. The patient is also given the option of providing their own chaperone as one is not provided in our facility. The patient also has the right and is explained the right to defer or refuse any part of the evaluation or treatment including the internal exam. With the patient's consent, PT will use one gloved finger to gently assess the muscles of the pelvic floor, seeing how well it contracts and relaxes and if there is muscle symmetry. After, the patient will get dressed and PT and patient will discuss exam findings and plan of care. PT and patient discuss plan of care,  schedule, attendance policy and HEP activities.  PELVIC MMT:   MMT eval  Vaginal 0/5,  0 quick flicks, 0 second hold   Internal Anal Sphincter   External Anal Sphincter   Puborectalis   Diastasis Recti   (Blank rows = not tested)  TONE: Low bilaterally in superficial and deep pelvic floor musculature   PROLAPSE: N/A   TODAY'S TREATMENT:                                                                                                                              DATE:   EVAL 11/02/23: Examination completed, findings reviewed, pt educated on POC. Pt motivated to participate in PT and agreeable to attempt recommendations.  Neuro re-ed: Hooklying diaphragmatic breathing + pelvic floor lengthening with inhalation and transverse abdominis activation with exhalation 2x10   PATIENT EDUCATION:  Education details: [email or web address] Person educated: Patient Education method: Explanation, Demonstration, Tactile cues, Verbal cues, and Handouts Education comprehension: verbalized understanding, returned demonstration, verbal cues required, tactile cues required, and needs further education  HOME EXERCISE PROGRAM: Access Code: [email or web address] URL: [email or web address] Date: 11/02/2023 Prepared by: Celena Domino  Exercises - Supine Pelvic Floor Contraction  - 1 x daily - 7 x weekly - 3 sets - 10 reps - Quick Flick Pelvic Floor Contractions in Hooklying  - 1 x daily - 7 x weekly - 3 sets - 10 reps  ASSESSMENT:  CLINICAL IMPRESSION: Patient is a 73 y.o. female  who was seen today for physical therapy evaluation and treatment for urinary incontinence. She has increased urinary urgency and cannot make it to the bathroom in time. She has a hard time holding her urine when she is driving. She has estradiol  cream at home. General lumbopelvic stiffness present with examination, some glute and hip weakness present also. Patient fully consents to today's internal examination. She demonstrates low tone  throughout the pelvic floor with weakness present. She is unable to complete a pelvic floor contraction independently in hooklying, but with deep core activation paired with an exhale, she is able to create a small pelvic floor contraction. There is a lack of coordination present in the pelvic floor along with stiffness, most likely due to not using the musculature. Overall patient tolerated session well and Pt would benefit from additional PT to further address deficits.     OBJECTIVE IMPAIRMENTS: decreased coordination, decreased endurance, decreased mobility, decreased ROM, decreased strength, and pain.   ACTIVITY LIMITATIONS: continence  PARTICIPATION LIMITATIONS: community activity  PERSONAL FACTORS: Age, Past/current experiences, and Time since onset of injury/illness/exacerbation are also affecting patient's functional outcome.   REHAB POTENTIAL: Good  CLINICAL DECISION MAKING: Stable/uncomplicated  EVALUATION COMPLEXITY: Low   GOALS: Goals reviewed with patient? Yes  SHORT TERM GOALS: Target date: 11/30/2023  Pt will be independent with HEP.  Baseline: Goal status: INITIAL  2.  Pt will be independent with the knack, urge suppression technique, and double voiding in order to improve bladder habits and decrease urinary incontinence.   Baseline:  Goal status: INITIAL  3.  Pt will be independent with  use of squatty potty, relaxed toileting mechanics, and improved bowel movement techniques in order to increase ease of bowel movements and complete evacuation.   Baseline:  Goal status: INITIAL  LONG TERM GOALS: Target date: 05/04/2024  Pt will be independent with advanced HEP.  Baseline:  Goal status: INITIAL  2.  Pt to demonstrate improved coordination of pelvic floor and breathing mechanics with 10# squat with appropriate synergistic patterns to decrease pain and leakage at least 75% of the time for improved ability to complete a 30 minute workout with strain at pelvic floor  and symptoms.   Baseline:  Goal status: INITIAL  3.  Pt will be able to functional actions such as exercise/cough/laugh without leakage to decrease need for pad use and improve quality of life.  Baseline:  Goal status: INITIAL  4.  Pt will have 80% reduced leakage during a typical day to improve quality of life.  Baseline:  Goal status: INITIAL  PLAN:  PT FREQUENCY: 1-2x/week  PT DURATION: 12 weeks  PLANNED INTERVENTIONS: 97110-Therapeutic exercises, 97530- Therapeutic activity, 97112- Neuromuscular re-education, 97535- Self Care, [id number]- Manual therapy, Patient/Family education, Taping, Joint mobilization, Spinal mobilization, Scar mobilization, Cryotherapy, and Moist heat  PLAN FOR NEXT SESSION: continued pelvic floor training in seated, introduce glute and hip strengthening, introduce knack technique and urge drill   Celena JAYSON Domino, PT 11/02/2023, 2:42 PM

## 2023-11-14 ENCOUNTER — Ambulatory Visit (INDEPENDENT_AMBULATORY_CARE_PROVIDER_SITE_OTHER): Admitting: Nurse Practitioner

## 2023-11-14 ENCOUNTER — Encounter: Payer: Self-pay | Admitting: Nurse Practitioner

## 2023-11-14 VITALS — BP 116/76 | HR 77 | Ht 64.25 in | Wt 154.0 lb

## 2023-11-14 DIAGNOSIS — Z780 Asymptomatic menopausal state: Secondary | ICD-10-CM | POA: Diagnosis not present

## 2023-11-14 DIAGNOSIS — M85852 Other specified disorders of bone density and structure, left thigh: Secondary | ICD-10-CM

## 2023-11-14 DIAGNOSIS — M85851 Other specified disorders of bone density and structure, right thigh: Secondary | ICD-10-CM

## 2023-11-14 DIAGNOSIS — N3281 Overactive bladder: Secondary | ICD-10-CM

## 2023-11-14 DIAGNOSIS — Z01419 Encounter for gynecological examination (general) (routine) without abnormal findings: Secondary | ICD-10-CM | POA: Diagnosis not present

## 2023-11-14 DIAGNOSIS — N958 Other specified menopausal and perimenopausal disorders: Secondary | ICD-10-CM | POA: Diagnosis not present

## 2023-11-14 NOTE — Patient Instructions (Signed)

## 2023-11-14 NOTE — Progress Notes (Signed)
 Terri Henderson March 30, 1951 [id number]   History:  73 y.o. G0 presents for breast and pelvic exam. Postmenopausal - no HRT, no bleeding. Normal pap history. History of IBS, vitamin D  deficiency, depression, endometriosis, osteopenia. Seeing urogyn for OAB, GSM - Prescribed vaginal estrogen and Gemtesa. Started estrogen but wanted to talk about Gemtesa today. Doing PFPT.    Gynecologic History No LMP recorded. Patient is postmenopausal.   Contraception/Family planning: post menopausal status Sexually active: No  Health maintenance Last Pap: 11/03/2015. Results were: Normal Last mammogram: 2024. Results were: Normal Colonoscopy: 10/24/2013. Results were: Normal, 10-year recall Dexa: 09/2023. Results were: osteopenia  Past medical history, past surgical history, family history and social history were all reviewed and documented in the EPIC chart. Married. 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   ROS:  A ROS was performed and pertinent positives and negatives are included.  Exam:  Vitals:   11/14/23 1408  BP: 116/76  Pulse: 77  SpO2: 97%  Weight: 154 lb (69.9 kg)  Height: 5' 4.25 (1.632 m)     Body mass index is 26.23 kg/m.  General appearance:  Normal Thyroid:  Symmetrical, normal in size, without palpable masses or nodularity. Respiratory  Auscultation:  Clear without wheezing or rhonchi Cardiovascular  Auscultation:  Regular rate, without rubs, murmurs or gallops  Edema/varicosities:  Not grossly evident Abdominal  Soft,nontender, without masses, guarding or rebound.  Liver/spleen:  No organomegaly noted  Hernia:  None appreciated  Skin  Inspection:  Grossly normal   Breasts: Examined lying and sitting.   Right: Without masses, retractions, discharge or axillary adenopathy.   Left: Without masses, retractions, discharge or axillary adenopathy. Pelvic: External genitalia:  no lesions              Urethra:  normal appearing urethra with no masses,  tenderness or lesions              Bartholins and Skenes: normal                 Vagina: normal appearing vagina with normal color and discharge, no lesions. Atrophic changes              Cervix: no lesions Bimanual Exam:  Uterus:  no masses or tenderness              Adnexa: no mass, fullness, tenderness              Rectovaginal: Deferred              Anus:  normal, no lesions  Patient informed chaperone available to be present for breast and pelvic exam. Patient has requested no chaperone to be present. Patient has been advised what will be completed during breast and pelvic exam.   Assessment/Plan:  73 y.o. MWF G0 for breast and pelvic exam.   Encounter for breast and pelvic examination - Education provided on SBEs, importance of preventative screenings, current guidelines, high calcium diet, regular exercise, and multivitamin daily. Labs with PCP.   Postmenopausal - No HRT, no bleeding.  Osteopenia of multiple sites - Managed by PCP. DXA 09/2023. Continue Vitamin D  + Calcium supplement and regular exercise.   Genitourinary syndrome of menopause - seeing urogyn. Started vaginal estrogen.   Overactive bladder - seeing urogyn. Prescribed Gemtesa but has not started yet. Discussed medication and indications. Continue PFPT.   Screening for cervical cancer - Normal Pap history. No longer screening per guidelines.   Screening for breast cancer - Normal mammogram history.  Continue annual screenings.  Normal breast exam today.   Screening for colon cancer - Colonoscopy 2015. Will repeat at 10-year interval per GI's recommendation.   Return in about 2 years (around 11/13/2025) for B&P.     Terri Henderson [facility], 2:58 PM 11/14/2023

## 2023-11-15 ENCOUNTER — Ambulatory Visit: Admitting: Physical Therapy

## 2023-11-15 DIAGNOSIS — M6281 Muscle weakness (generalized): Secondary | ICD-10-CM | POA: Diagnosis not present

## 2023-11-15 DIAGNOSIS — R279 Unspecified lack of coordination: Secondary | ICD-10-CM | POA: Diagnosis not present

## 2023-11-15 DIAGNOSIS — R293 Abnormal posture: Secondary | ICD-10-CM | POA: Diagnosis not present

## 2023-11-15 NOTE — Therapy (Signed)
 OUTPATIENT PHYSICAL THERAPY FEMALE PELVIC TREATMENT   Patient Name: Terri Henderson MRN: [id number] DOB:Jul 29, 1950, 73 y.o., female Today's Date: 11/15/2023  END OF SESSION:  PT End of Session - 11/15/23 1606     Visit Number 2    Number of Visits 8    Date for PT Re-Evaluation 11/30/23    Authorization Type [employer] MCR    PT Start Time 0330    PT Stop Time 0415    PT Time Calculation (min) 45 min    Activity Tolerance Patient tolerated treatment well    Behavior During Therapy [facility] [place] [facility] for tasks assessed/performed           Past Medical History:  Diagnosis Date   Anxiety    Endometriosis    IBS (irritable bowel syndrome)    Rosacea    Past Surgical History:  Procedure Laterality Date   COLONOSCOPY     LAPAROSCOPIC OVARIAN CYSTECTOMY     PELVIC LAPAROSCOPY     Patient Active Problem List   Diagnosis Date Noted   Osteopenia after menopause 05/08/2018   Depression 05/08/2018    PCP: Verena Mems, MD  REFERRING PROVIDER: Alvia Dorothyann LABOR, MD   REFERRING DIAG: N32.81 (ICD-10-CM) - Overactive bladder  THERAPY DIAG:  Muscle weakness (generalized)  Rationale for Evaluation and Treatment: Rehabilitation  ONSET DATE: 2023  SUBJECTIVE:                                                                                                                                                                                           SUBJECTIVE STATEMENT: Patient reports that she is doing well today. She went to the OBGYN yesterday - everything checked out well. She has been semi-consistent with her HEP. She was working out in the yard one time since last visit and had some urinary leakage due to a high sense of urgency. She has been consistent with her Estradiol  use.   Fluid intake: Her fluid intake includes 2 to 3 mugs of caffeinated coffee in the morning, 7 to 8 glasses of water throughout the day, and occasional sparkling water in the evening. She also consumes sodas or  teas once a week and a glass of wine 4 to 5 times a week.   PAIN:  Are you having pain? No NPRS scale: 0/10  PRECAUTIONS: None  RED FLAGS: None   WEIGHT BEARING RESTRICTIONS: No  FALLS:  Has patient fallen in last 6 months? No  OCCUPATION: mostly retired, works occasionally for [employer] on an as needed basis   ACTIVITY LEVEL : walks, silver sneakers classes or online, tai chi  PLOF: Independent with basic  ADLs  PATIENT GOALS: decrease urinary   PERTINENT HISTORY:  Hx: osteopenia after menopause, ovarian cyst removal, pelvic laparoscopy  Sexual abuse: No  BOWEL MOVEMENT: Pain with bowel movement: No Type of bowel movement:Type (Bristol Stool Scale) 2-3, Frequency daily , Strain no, and Splinting no Fully empty rectum: Yes:   Leakage: No Pads: No Fiber supplement/laxative Yes - takes magnesium   URINATION: Pain with urination: No Fully empty bladder: Nobut tries to lean back and go more  Stream: Strong Urgency: Yes  Frequency: within normal limits - gets up 1-3x/night  Leakage: Urge to void, Coughing, Sneezing, and Exercise Pads: No  INTERCOURSE:  Ability to have vaginal penetration No  Pain with intercourse: none DrynessYes   PREGNANCY: Vaginal deliveries 0 C-section deliveries 0  PROLAPSE: None  OBJECTIVE:  Note: Objective measures were completed at Evaluation unless otherwise noted.  PATIENT SURVEYS:  PFIQ-7: 60  COGNITION: Overall cognitive status: Within functional limits for tasks assessed     SENSATION: Light touch: Appears intact  LUMBAR SPECIAL TESTS:  Single leg stance test: Positive  FUNCTIONAL TESTS:  Squat:   GAIT: Assistive device utilized: none Comments: mild trendelenburg gait pattern with ambulation   POSTURE: rounded shoulders, forward head, and flexed trunk   LUMBARAROM/PROM: within normal limits bilaterally   LOWER EXTREMITY ROM: within normal limits for bilateral lower extremities   LOWER EXTREMITY MMT: 4/5  bilateral knees and hips grossly   PALPATION:   General: no significant tenderness to palpation of bilateral adductors or hip flexors   Pelvic Alignment: within normal limits   Abdominal: upper chest breathing and abdominal bracing at rest, decreased lower rib excursion with inhalation                 External Perineal Exam: minimal dryness present with sufficient clitoral hood mobility, no tenderness to external pelvic floor muscle palpation                             Internal Pelvic Floor: Patient fully consents to today's internal examination. She demonstrates low tone throughout the pelvic floor with weakness present. She is unable to complete a pelvic floor contraction independently in hooklying, but with deep core activation paired with an exhale, she is able to create a small pelvic floor contraction. There is a lack of coordination present in the pelvic floor along with stiffness, most likely due to not using the musculature.   Patient confirms identification and approves PT to assess internal pelvic floor and treatment Yes No emotional/communication barriers or cognitive limitation. Patient is motivated to learn. Patient understands and agrees with treatment goals and plan. PT explains patient will be examined in standing, sitting, and lying down to see how their muscles and joints work. When they are ready, they will be asked to remove their underwear so PT can examine their perineum. The patient is also given the option of providing their own chaperone as one is not provided in our facility. The patient also has the right and is explained the right to defer or refuse any part of the evaluation or treatment including the internal exam. With the patient's consent, PT will use one gloved finger to gently assess the muscles of the pelvic floor, seeing how well it contracts and relaxes and if there is muscle symmetry. After, the patient will get dressed and PT and patient will discuss exam  findings and plan of care. PT and patient discuss plan of care, schedule,  attendance policy and HEP activities.  PELVIC MMT:   MMT eval  Vaginal 0/5, 0 quick flicks, 0 second hold   Internal Anal Sphincter   External Anal Sphincter   Puborectalis   Diastasis Recti   (Blank rows = not tested)  TONE: Low bilaterally in superficial and deep pelvic floor musculature   PROLAPSE: N/A   TODAY'S TREATMENT:                                                                                                                              DATE:   EVAL 11/02/23: Examination completed, findings reviewed, pt educated on POC. Pt motivated to participate in PT and agreeable to attempt recommendations.  Neuro re-ed: Hooklying diaphragmatic breathing + pelvic floor lengthening with inhalation and transverse abdominis activation with exhalation 2x10   11/15/23: Neuro re-ed: seated diaphragmatic breathing + pelvic floor lengthening with inhalation and transverse abdominis activation with exhalation 2x10  Thereapeutic exercise: Sit to stand + adductor ball squeeze + diaphragmatic breathing 2x10  Bridge + adductor ball squeeze + diaphragmatic breathing 2x10  Sidelying clamshell + reverse clamshell + diaphragmatic breathing 2x10 each side  Self care: Urge drill for urge urinary incontinence   PATIENT EDUCATION:  Education details: [id number] Person educated: Patient Education method: Explanation, Demonstration, Tactile cues, Verbal cues, and Handouts Education comprehension: verbalized understanding, returned demonstration, verbal cues required, tactile cues required, and needs further education  HOME EXERCISE PROGRAM: Access Code: [email or web address] URL: [email or web address] Date: 11/15/2023 Prepared by: Celena Domino  Exercises - Seated Pelvic Floor Contraction  - 1 x daily - 7 x weekly - 2 sets - 10 reps - Sit to Stand with Mercer Between Knees  - 1 x daily - 7 x weekly - 2 sets - 10 reps - Supine  Bridge with Mini Swiss Ball Between Knees  - 1 x daily - 7 x weekly - 2 sets - 10 reps - Clamshell  - 1 x daily - 7 x weekly - 2 sets - 10 reps - Sidelying Reverse Clamshell  - 1 x daily - 7 x weekly - 2 sets - 10 reps  ASSESSMENT:  CLINICAL IMPRESSION: Patient is a 73 y.o. female  who was seen today for physical therapy treatment for urinary incontinence. She has only had 1 instance of almost-leakage over the time she has been out of PT. She is consistent with her HEP and we progressed all exercises accordingly today. 0/10 pain at end of session, max cueing required from PT to properly coordinate breathing cues with exercises today. Overall patient tolerated session well and Pt would benefit from additional PT to further address deficits.     OBJECTIVE IMPAIRMENTS: decreased coordination, decreased endurance, decreased mobility, decreased ROM, decreased strength, and pain.   ACTIVITY LIMITATIONS: continence  PARTICIPATION LIMITATIONS: community activity  PERSONAL FACTORS: Age, Past/current experiences, and Time since onset of injury/illness/exacerbation are also affecting patient's functional outcome.   REHAB POTENTIAL: Good  CLINICAL DECISION MAKING: Stable/uncomplicated  EVALUATION COMPLEXITY: Low   GOALS: Goals reviewed with patient? Yes  SHORT TERM GOALS: Target date: 11/30/2023  Pt will be independent with HEP.  Baseline: Goal status: INITIAL  2.  Pt will be independent with the knack, urge suppression technique, and double voiding in order to improve bladder habits and decrease urinary incontinence.   Baseline:  Goal status: INITIAL  3.  Pt will be independent with use of squatty potty, relaxed toileting mechanics, and improved bowel movement techniques in order to increase ease of bowel movements and complete evacuation.   Baseline:  Goal status: INITIAL  LONG TERM GOALS: Target date: 05/04/2024  Pt will be independent with advanced HEP.  Baseline:  Goal status:  INITIAL  2.  Pt to demonstrate improved coordination of pelvic floor and breathing mechanics with 10# squat with appropriate synergistic patterns to decrease pain and leakage at least 75% of the time for improved ability to complete a 30 minute workout with strain at pelvic floor and symptoms.   Baseline:  Goal status: INITIAL  3.  Pt will be able to functional actions such as exercise/cough/laugh without leakage to decrease need for pad use and improve quality of life.  Baseline:  Goal status: INITIAL  4.  Pt will have 80% reduced leakage during a typical day to improve quality of life.  Baseline:  Goal status: INITIAL  PLAN:  PT FREQUENCY: 1-2x/week  PT DURATION: 12 weeks  PLANNED INTERVENTIONS: 97110-Therapeutic exercises, 97530- Therapeutic activity, 97112- Neuromuscular re-education, 97535- Self Care, [id number]- Manual therapy, Patient/Family education, Taping, Joint mobilization, Spinal mobilization, Scar mobilization, Cryotherapy, and Moist heat  PLAN FOR NEXT SESSION: continued pelvic floor training in seated, introduce glute and hip strengthening, introduce knack technique and urge drill   Celena JAYSON Domino, PT 11/15/2023, 4:07 PM

## 2023-11-15 NOTE — Patient Instructions (Signed)

## 2023-11-23 DIAGNOSIS — F330 Major depressive disorder, recurrent, mild: Secondary | ICD-10-CM | POA: Diagnosis not present

## 2023-11-27 DIAGNOSIS — Z1231 Encounter for screening mammogram for malignant neoplasm of breast: Secondary | ICD-10-CM | POA: Diagnosis not present

## 2023-11-27 LAB — HM MAMMOGRAPHY

## 2023-12-12 DIAGNOSIS — D2239 Melanocytic nevi of other parts of face: Secondary | ICD-10-CM | POA: Diagnosis not present

## 2023-12-12 DIAGNOSIS — I8391 Asymptomatic varicose veins of right lower extremity: Secondary | ICD-10-CM | POA: Diagnosis not present

## 2023-12-12 DIAGNOSIS — I8392 Asymptomatic varicose veins of left lower extremity: Secondary | ICD-10-CM | POA: Diagnosis not present

## 2023-12-12 DIAGNOSIS — L738 Other specified follicular disorders: Secondary | ICD-10-CM | POA: Diagnosis not present

## 2023-12-12 DIAGNOSIS — L821 Other seborrheic keratosis: Secondary | ICD-10-CM | POA: Diagnosis not present

## 2023-12-12 DIAGNOSIS — D692 Other nonthrombocytopenic purpura: Secondary | ICD-10-CM | POA: Diagnosis not present

## 2023-12-12 DIAGNOSIS — L649 Androgenic alopecia, unspecified: Secondary | ICD-10-CM | POA: Diagnosis not present

## 2023-12-21 ENCOUNTER — Ambulatory Visit: Admitting: Physical Therapy

## 2023-12-29 ENCOUNTER — Ambulatory Visit: Attending: Obstetrics and Gynecology | Admitting: Physical Therapy

## 2023-12-29 DIAGNOSIS — M6281 Muscle weakness (generalized): Secondary | ICD-10-CM | POA: Insufficient documentation

## 2023-12-29 DIAGNOSIS — R279 Unspecified lack of coordination: Secondary | ICD-10-CM | POA: Diagnosis present

## 2023-12-29 DIAGNOSIS — R293 Abnormal posture: Secondary | ICD-10-CM | POA: Insufficient documentation

## 2023-12-29 NOTE — Therapy (Signed)
 OUTPATIENT PHYSICAL THERAPY FEMALE PELVIC TREATMENT   Patient Name: Terri Henderson MRN: [id number] DOB:11-24-1950, 73 y.o., female Today's Date: 12/29/2023  END OF SESSION:  PT End of Session - 12/29/23 1133     Visit Number 3    Number of Visits 8    Date for PT Re-Evaluation 11/30/23    Authorization Type [employer] M[employer]    PT Start Time 1100    PT Stop Time 1140    PT Time Calculation (min) 40 min    Activity Tolerance Patient tolerated treatment well    Behavior During Therapy WFL for tasks assessed/performed            Past Medical History:  Diagnosis Date   Anxiety    Endometriosis    IBS (irritable bowel syndrome)    Rosacea    Past Surgical History:  Procedure Laterality Date   COLONOSCOPY     LAPAROSCOPIC OVARIAN CYSTECTOMY     PELVIC LAPAROSCOPY     Patient Active Problem List   Diagnosis Date Noted   Osteopenia after menopause 05/08/2018   Depression 05/08/2018    PCP: Verena Mems, MD  REFERRING PROVIDER: Alvia Dorothyann LABOR, MD   REFERRING DIAG: N32.81 (ICD-10-CM) - Overactive bladder  THERAPY DIAG:  Muscle weakness (generalized)  Unspecified lack of coordination  Abnormal posture  Rationale for Evaluation and Treatment: Rehabilitation  ONSET DATE: 2023  SUBJECTIVE:                                                                                                                                                                                           SUBJECTIVE STATEMENT: She thinks that her pelvic floor is doing slightly better. She has been noticing improvements in urgency and leakage. She was constipated but taking magnesium helps with this. She has been consistent with her Estradiol  use.   Fluid intake: Her fluid intake includes 2 to 3 mugs of caffeinated coffee in the morning, 7 to 8 glasses of water throughout the day, and occasional sparkling water in the evening. She also consumes sodas or teas once a week and a glass of wine 4 to  5 times a week.   PAIN:  Are you having pain? No NPRS scale: 0/10  PRECAUTIONS: None  RED FLAGS: None   WEIGHT BEARING RESTRICTIONS: No  FALLS:  Has patient fallen in last 6 months? No  OCCUPATION: mostly retired, works occasionally for [employer] on an as needed basis   ACTIVITY LEVEL : walks, silver sneakers classes or online, tai chi  PLOF: Independent with basic ADLs  PATIENT GOALS: decrease urinary   PERTINENT HISTORY:  Hx: osteopenia  after menopause, ovarian cyst removal, pelvic laparoscopy  Sexual abuse: No  BOWEL MOVEMENT: Pain with bowel movement: No Type of bowel movement:Type (Bristol Stool Scale) 2-3, Frequency daily , Strain no, and Splinting no Fully empty rectum: Yes:   Leakage: No Pads: No Fiber supplement/laxative Yes - takes magnesium   URINATION: Pain with urination: No Fully empty bladder: Nobut tries to lean back and go more  Stream: Strong Urgency: Yes  Frequency: within normal limits - gets up 1-3x/night  Leakage: Urge to void, Coughing, Sneezing, and Exercise Pads: No  INTERCOURSE:  Ability to have vaginal penetration No  Pain with intercourse: none DrynessYes   PREGNANCY: Vaginal deliveries 0 C-section deliveries 0  PROLAPSE: None  OBJECTIVE:  Note: Objective measures were completed at Evaluation unless otherwise noted.  PATIENT SURVEYS:  PFIQ-7: 61  COGNITION: Overall cognitive status: Within functional limits for tasks assessed     SENSATION: Light touch: Appears intact  LUMBAR SPECIAL TESTS:  Single leg stance test: Positive  FUNCTIONAL TESTS:  Squat:   GAIT: Assistive device utilized: none Comments: mild trendelenburg gait pattern with ambulation   POSTURE: rounded shoulders, forward head, and flexed trunk   LUMBARAROM/PROM: within normal limits bilaterally   LOWER EXTREMITY ROM: within normal limits for bilateral lower extremities   LOWER EXTREMITY MMT: 4/5 bilateral knees and hips grossly    PALPATION:   General: no significant tenderness to palpation of bilateral adductors or hip flexors   Pelvic Alignment: within normal limits   Abdominal: upper chest breathing and abdominal bracing at rest, decreased lower rib excursion with inhalation                 External Perineal Exam: minimal dryness present with sufficient clitoral hood mobility, no tenderness to external pelvic floor muscle palpation                             Internal Pelvic Floor: Patient fully consents to today's internal examination. She demonstrates low tone throughout the pelvic floor with weakness present. She is unable to complete a pelvic floor contraction independently in hooklying, but with deep core activation paired with an exhale, she is able to create a small pelvic floor contraction. There is a lack of coordination present in the pelvic floor along with stiffness, most likely due to not using the musculature.   Patient confirms identification and approves PT to assess internal pelvic floor and treatment Yes No emotional/communication barriers or cognitive limitation. Patient is motivated to learn. Patient understands and agrees with treatment goals and plan. PT explains patient will be examined in standing, sitting, and lying down to see how their muscles and joints work. When they are ready, they will be asked to remove their underwear so PT can examine their perineum. The patient is also given the option of providing their own chaperone as one is not provided in our facility. The patient also has the right and is explained the right to defer or refuse any part of the evaluation or treatment including the internal exam. With the patient's consent, PT will use one gloved finger to gently assess the muscles of the pelvic floor, seeing how well it contracts and relaxes and if there is muscle symmetry. After, the patient will get dressed and PT and patient will discuss exam findings and plan of care. PT and  patient discuss plan of care, schedule, attendance policy and HEP activities.  PELVIC MMT:   MMT eval  Vaginal 0/5, 0 quick flicks, 0 second hold   Internal Anal Sphincter   External Anal Sphincter   Puborectalis   Diastasis Recti   (Blank rows = not tested)  TONE: Low bilaterally in superficial and deep pelvic floor musculature   PROLAPSE: N/A   TODAY'S TREATMENT:                                                                                                                              DATE:   EVAL 11/02/23: Examination completed, findings reviewed, pt educated on POC. Pt motivated to participate in PT and agreeable to attempt recommendations.  Neuro re-ed: Hooklying diaphragmatic breathing + pelvic floor lengthening with inhalation and transverse abdominis activation with exhalation 2x10   11/15/23: Neuro re-ed: seated diaphragmatic breathing + pelvic floor lengthening with inhalation and transverse abdominis activation with exhalation 2x10  Thereapeutic exercise: Sit to stand + adductor ball squeeze + diaphragmatic breathing 2x10  Bridge + adductor ball squeeze + diaphragmatic breathing 2x10  Sidelying clamshell + reverse clamshell + diaphragmatic breathing 2x10 each side  Self care: Urge drill for urge urinary incontinence   12/29/23: Sidelying clamshell (GTB) + diaphragmatic breathing 2x10  Sidelying reverse clamshell + diaphragmatic breathing 2x10  Bridge + hip abduction (GTB) + diaphragmatic breathing 2x10  Sit to stand + hip abduction (GTB) + diaphragmatic breathing 2x10  Standing pelvic floor contraction + diaphragmatic breathing 2x10  Education for performing urge drill in times of increased urgency   PATIENT EDUCATION:  Education details: Pension scheme manager Person educated: Patient Education method: Programmer, multimedia, Facilities manager, Actor cues, Verbal cues, and Handouts Education comprehension: verbalized understanding, returned demonstration, verbal cues required, tactile  cues required, and needs further education  HOME EXERCISE PROGRAM: Access Code: [email or web address] URL: [email or web address] Date: 12/29/2023 Prepared by: Celena Domino  Exercises - Standing Pelvic Floor Contraction  - 1 x daily - 7 x weekly - 2 sets - 10 reps - Sit to Stand with Resistance Around Legs  - 1 x daily - 7 x weekly - 2 sets - 10 reps - Bridge with Hip Abduction and Resistance  - 1 x daily - 7 x weekly - 2 sets - 10 reps - Clamshell with Resistance  - 1 x daily - 7 x weekly - 2 sets - 10 reps - Sidelying Reverse Clamshell  - 1 x daily - 7 x weekly - 2 sets - 10 reps  ASSESSMENT:  CLINICAL IMPRESSION: Patient is a 73 y.o. female  who was seen today for physical therapy treatment for urinary incontinence. She has been noticing improvements in her ability to control her urgency and leakage. She is consistent with her HEP and we progressed all exercises accordingly today. 0/10 pain at end of session, max cueing required from PT to properly coordinate breathing cues with exercises today. Overall patient tolerated session well and Pt would benefit from additional PT to further address deficits.     OBJECTIVE IMPAIRMENTS: decreased coordination, decreased endurance, decreased mobility,  decreased ROM, decreased strength, and pain.   ACTIVITY LIMITATIONS: continence  PARTICIPATION LIMITATIONS: community activity  PERSONAL FACTORS: Age, Past/current experiences, and Time since onset of injury/illness/exacerbation are also affecting patient's functional outcome.   REHAB POTENTIAL: Good  CLINICAL DECISION MAKING: Stable/uncomplicated  EVALUATION COMPLEXITY: Low   GOALS: Goals reviewed with patient? Yes  SHORT TERM GOALS: Target date: 11/30/2023  Pt will be independent with HEP.  Baseline: Goal status: INITIAL  2.  Pt will be independent with the knack, urge suppression technique, and double voiding in order to improve bladder habits and decrease urinary incontinence.    Baseline:  Goal status: INITIAL  3.  Pt will be independent with use of squatty potty, relaxed toileting mechanics, and improved bowel movement techniques in order to increase ease of bowel movements and complete evacuation.   Baseline:  Goal status: INITIAL  LONG TERM GOALS: Target date: 05/04/2024  Pt will be independent with advanced HEP.  Baseline:  Goal status: INITIAL  2.  Pt to demonstrate improved coordination of pelvic floor and breathing mechanics with 10# squat with appropriate synergistic patterns to decrease pain and leakage at least 75% of the time for improved ability to complete a 30 minute workout with strain at pelvic floor and symptoms.   Baseline:  Goal status: INITIAL  3.  Pt will be able to functional actions such as exercise/cough/laugh without leakage to decrease need for pad use and improve quality of life.  Baseline:  Goal status: INITIAL  4.  Pt will have 80% reduced leakage during a typical day to improve quality of life.  Baseline:  Goal status: INITIAL  PLAN:  PT FREQUENCY: 1-2x/week  PT DURATION: 12 weeks  PLANNED INTERVENTIONS: 97110-Therapeutic exercises, 97530- Therapeutic activity, 97112- Neuromuscular re-education, 97535- Self Care, [id number]- Manual therapy, Patient/Family education, Taping, Joint mobilization, Spinal mobilization, Scar mobilization, Cryotherapy, and Moist heat  PLAN FOR NEXT SESSION: continued pelvic floor training in seated, introduce glute and hip strengthening, introduce knack technique and urge drill   Celena JAYSON Domino, PT 12/29/2023, 11:33 AM

## 2024-01-05 ENCOUNTER — Ambulatory Visit: Admitting: Physical Therapy

## 2024-01-12 ENCOUNTER — Ambulatory Visit: Admitting: Physical Therapy

## 2024-01-12 DIAGNOSIS — M6281 Muscle weakness (generalized): Secondary | ICD-10-CM

## 2024-01-12 DIAGNOSIS — R279 Unspecified lack of coordination: Secondary | ICD-10-CM

## 2024-01-12 DIAGNOSIS — R293 Abnormal posture: Secondary | ICD-10-CM

## 2024-01-12 NOTE — Therapy (Signed)
 OUTPATIENT PHYSICAL THERAPY FEMALE PELVIC TREATMENT   Patient Name: Terri Henderson MRN: [id number] DOB:04-27-1950, 73 y.o., female Today's Date: 01/12/2024  END OF SESSION:  PT End of Session - 01/12/24 1119     Visit Number 4    Number of Visits 8    Date for Recertification  11/30/23    Authorization Type [employer] M[employer]    PT Start Time 1100    PT Stop Time 1145    PT Time Calculation (min) 45 min    Activity Tolerance Patient tolerated treatment well    Behavior During Therapy WFL for tasks assessed/performed             Past Medical History:  Diagnosis Date   Anxiety    Endometriosis    IBS (irritable bowel syndrome)    Rosacea    Past Surgical History:  Procedure Laterality Date   COLONOSCOPY     LAPAROSCOPIC OVARIAN CYSTECTOMY     PELVIC LAPAROSCOPY     Patient Active Problem List   Diagnosis Date Noted   Osteopenia after menopause 05/08/2018   Depression 05/08/2018    PCP: Verena Mems, MD  REFERRING PROVIDER: Alvia Dorothyann LABOR, MD   REFERRING DIAG: N32.81 (ICD-10-CM) - Overactive bladder  THERAPY DIAG:  Muscle weakness (generalized)  Unspecified lack of coordination  Abnormal posture  Rationale for Evaluation and Treatment: Rehabilitation  ONSET DATE: 2023  SUBJECTIVE:                                                                                                                                                                                           SUBJECTIVE STATEMENT: She thinks she is getting better. She hasn't had any urinary leakage this week. She has been taking magnesium to help with constipation. She has been consistent with her Estradiol  use.   Fluid intake: Her fluid intake includes 2 to 3 mugs of caffeinated coffee in the morning, 7 to 8 glasses of water throughout the day, and occasional sparkling water in the evening. She also consumes sodas or teas once a week and a glass of wine 4 to 5 times a week.   PAIN:  Are you  having pain? No NPRS scale: 0/10  PRECAUTIONS: None  RED FLAGS: None   WEIGHT BEARING RESTRICTIONS: No  FALLS:  Has patient fallen in last 6 months? No  OCCUPATION: mostly retired, works occasionally for [employer] on an as needed basis   ACTIVITY LEVEL : walks, silver sneakers classes or online, tai chi  PLOF: Independent with basic ADLs  PATIENT GOALS: decrease urinary   PERTINENT HISTORY:  Hx: osteopenia after menopause, ovarian cyst  removal, pelvic laparoscopy  Sexual abuse: No  BOWEL MOVEMENT: Pain with bowel movement: No Type of bowel movement:Type (Bristol Stool Scale) 2-3, Frequency daily , Strain no, and Splinting no Fully empty rectum: Yes:   Leakage: No Pads: No Fiber supplement/laxative Yes - takes magnesium   URINATION: Pain with urination: No Fully empty bladder: Nobut tries to lean back and go more  Stream: Strong Urgency: Yes  Frequency: within normal limits - gets up 1-3x/night  Leakage: Urge to void, Coughing, Sneezing, and Exercise Pads: No  INTERCOURSE:  Ability to have vaginal penetration No  Pain with intercourse: none DrynessYes   PREGNANCY: Vaginal deliveries 0 C-section deliveries 0  PROLAPSE: None  OBJECTIVE:  Note: Objective measures were completed at Evaluation unless otherwise noted.  PATIENT SURVEYS:  PFIQ-7: 33  COGNITION: Overall cognitive status: Within functional limits for tasks assessed     SENSATION: Light touch: Appears intact  LUMBAR SPECIAL TESTS:  Single leg stance test: Positive  FUNCTIONAL TESTS:  Squat:   GAIT: Assistive device utilized: none Comments: mild trendelenburg gait pattern with ambulation   POSTURE: rounded shoulders, forward head, and flexed trunk   LUMBARAROM/PROM: within normal limits bilaterally   LOWER EXTREMITY ROM: within normal limits for bilateral lower extremities   LOWER EXTREMITY MMT: 4/5 bilateral knees and hips grossly   PALPATION:   General: no significant  tenderness to palpation of bilateral adductors or hip flexors   Pelvic Alignment: within normal limits   Abdominal: upper chest breathing and abdominal bracing at rest, decreased lower rib excursion with inhalation                 External Perineal Exam: minimal dryness present with sufficient clitoral hood mobility, no tenderness to external pelvic floor muscle palpation                             Internal Pelvic Floor: Patient fully consents to today's internal examination. She demonstrates low tone throughout the pelvic floor with weakness present. She is unable to complete a pelvic floor contraction independently in hooklying, but with deep core activation paired with an exhale, she is able to create a small pelvic floor contraction. There is a lack of coordination present in the pelvic floor along with stiffness, most likely due to not using the musculature.   Patient confirms identification and approves PT to assess internal pelvic floor and treatment Yes No emotional/communication barriers or cognitive limitation. Patient is motivated to learn. Patient understands and agrees with treatment goals and plan. PT explains patient will be examined in standing, sitting, and lying down to see how their muscles and joints work. When they are ready, they will be asked to remove their underwear so PT can examine their perineum. The patient is also given the option of providing their own chaperone as one is not provided in our facility. The patient also has the right and is explained the right to defer or refuse any part of the evaluation or treatment including the internal exam. With the patient's consent, PT will use one gloved finger to gently assess the muscles of the pelvic floor, seeing how well it contracts and relaxes and if there is muscle symmetry. After, the patient will get dressed and PT and patient will discuss exam findings and plan of care. PT and patient discuss plan of care, schedule,  attendance policy and HEP activities.  PELVIC MMT:   MMT eval  Vaginal 0/5, 0 quick  flicks, 0 second hold   Internal Anal Sphincter   External Anal Sphincter   Puborectalis   Diastasis Recti   (Blank rows = not tested)  TONE: Low bilaterally in superficial and deep pelvic floor musculature   PROLAPSE: N/A   TODAY'S TREATMENT:                                                                                                                              DATE:   EVAL 11/02/23: Examination completed, findings reviewed, pt educated on POC. Pt motivated to participate in PT and agreeable to attempt recommendations.  Neuro re-ed: Hooklying diaphragmatic breathing + pelvic floor lengthening with inhalation and transverse abdominis activation with exhalation 2x10   11/15/23: Neuro re-ed: seated diaphragmatic breathing + pelvic floor lengthening with inhalation and transverse abdominis activation with exhalation 2x10  Thereapeutic exercise: Sit to stand + adductor ball squeeze + diaphragmatic breathing 2x10  Bridge + adductor ball squeeze + diaphragmatic breathing 2x10  Sidelying clamshell + reverse clamshell + diaphragmatic breathing 2x10 each side  Self care: Urge drill for urge urinary incontinence   12/29/23: Sidelying clamshell (GTB) + diaphragmatic breathing 2x10  Sidelying reverse clamshell + diaphragmatic breathing 2x10  Bridge + hip abduction (GTB) + diaphragmatic breathing 2x10  Sit to stand + hip abduction (GTB) + diaphragmatic breathing 2x10  Standing pelvic floor contraction + diaphragmatic breathing 2x10  Education for performing urge drill in times of increased urgency   01/12/24: NuStep level 2 - 5 minutes - PT present to discuss current status  Standing pull down + single knee march + diaphragmatic breathing 2x10  Standing march + opposite shoulder press + diaphragmatic breathing 2x10  Squat + hip abduction (GTB) + diaphragmatic breathing 2x10   PATIENT EDUCATION:   Education details: Pension scheme manager Person educated: Patient Education method: Explanation, Demonstration, Tactile cues, Verbal cues, and Handouts Education comprehension: verbalized understanding, returned demonstration, verbal cues required, tactile cues required, and needs further education  HOME EXERCISE PROGRAM: Access Code: [email or web address] URL: [email or web address] Date: 01/12/2024 Prepared by: Celena Domino  Exercises - Standing Pelvic Floor Contraction  - 1 x daily - 7 x weekly - 2 sets - 10 reps - Squat with Resistance at Thighs  - 1 x daily - 7 x weekly - 2 sets - 10 reps - Bridge with Hip Abduction and Resistance  - 1 x daily - 7 x weekly - 2 sets - 10 reps - Clamshell with Resistance  - 1 x daily - 7 x weekly - 2 sets - 10 reps - Sidelying Reverse Clamshell  - 1 x daily - 7 x weekly - 2 sets - 10 reps - Resistance Pulldown with March  - 1 x daily - 7 x weekly - 2 sets - 10 reps - Standing March with Alternating [facility]  - 1 x daily - 7 x weekly - 2 sets - 10 reps  ASSESSMENT:  CLINICAL IMPRESSION: Patient is a 73 y.o. female  who was seen today for physical therapy treatment for urinary incontinence. She has been noticing improvements in her ability to control her urgency and leakage. She is consistent with her HEP and we progressed all exercises accordingly today. 0/10 pain at end of session, max cueing required from PT to properly coordinate breathing cues with exercises today. Overall patient tolerated session well and Pt would benefit from additional PT to further address deficits.     OBJECTIVE IMPAIRMENTS: decreased coordination, decreased endurance, decreased mobility, decreased ROM, decreased strength, and pain.   ACTIVITY LIMITATIONS: continence  PARTICIPATION LIMITATIONS: community activity  PERSONAL FACTORS: Age, Past/current experiences, and Time since onset of injury/illness/exacerbation are also affecting patient's functional outcome.   REHAB  POTENTIAL: Good  CLINICAL DECISION MAKING: Stable/uncomplicated  EVALUATION COMPLEXITY: Low   GOALS: Goals reviewed with patient? Yes  SHORT TERM GOALS: Target date: 11/30/2023  Pt will be independent with HEP.  Baseline: Goal status: INITIAL  2.  Pt will be independent with the knack, urge suppression technique, and double voiding in order to improve bladder habits and decrease urinary incontinence.   Baseline:  Goal status: INITIAL  3.  Pt will be independent with use of squatty potty, relaxed toileting mechanics, and improved bowel movement techniques in order to increase ease of bowel movements and complete evacuation.   Baseline:  Goal status: INITIAL  LONG TERM GOALS: Target date: 05/04/2024  Pt will be independent with advanced HEP.  Baseline:  Goal status: INITIAL  2.  Pt to demonstrate improved coordination of pelvic floor and breathing mechanics with 10# squat with appropriate synergistic patterns to decrease pain and leakage at least 75% of the time for improved ability to complete a 30 minute workout with strain at pelvic floor and symptoms.   Baseline:  Goal status: INITIAL  3.  Pt will be able to functional actions such as exercise/cough/laugh without leakage to decrease need for pad use and improve quality of life.  Baseline:  Goal status: INITIAL  4.  Pt will have 80% reduced leakage during a typical day to improve quality of life.  Baseline:  Goal status: INITIAL  PLAN:  PT FREQUENCY: 1-2x/week  PT DURATION: 12 weeks  PLANNED INTERVENTIONS: 97110-Therapeutic exercises, 97530- Therapeutic activity, 97112- Neuromuscular re-education, 97535- Self Care, [id number]- Manual therapy, Patient/Family education, Taping, Joint mobilization, Spinal mobilization, Scar mobilization, Cryotherapy, and Moist heat  PLAN FOR NEXT SESSION: continued pelvic floor training in seated, introduce glute and hip strengthening, introduce knack technique and urge drill   Celena JAYSON Domino, PT 01/12/2024, 11:40 AM

## 2024-01-15 ENCOUNTER — Encounter: Payer: Self-pay | Admitting: Internal Medicine

## 2024-01-19 ENCOUNTER — Encounter: Admitting: Physical Therapy

## 2024-01-26 ENCOUNTER — Encounter: Admitting: Physical Therapy

## 2024-02-02 ENCOUNTER — Encounter: Admitting: Physical Therapy

## 2024-02-07 ENCOUNTER — Encounter: Admitting: Physical Therapy

## 2024-02-28 ENCOUNTER — Ambulatory Visit: Attending: Obstetrics and Gynecology | Admitting: Physical Therapy

## 2024-02-28 DIAGNOSIS — R293 Abnormal posture: Secondary | ICD-10-CM | POA: Diagnosis present

## 2024-02-28 DIAGNOSIS — M6281 Muscle weakness (generalized): Secondary | ICD-10-CM | POA: Insufficient documentation

## 2024-02-28 DIAGNOSIS — R279 Unspecified lack of coordination: Secondary | ICD-10-CM | POA: Insufficient documentation

## 2024-02-28 NOTE — Therapy (Signed)
 OUTPATIENT PHYSICAL THERAPY FEMALE PELVIC TREATMENT   Patient Name: Terri Henderson MRN: [id number] DOB:[birth year], 73 y.o., female Today's Date: 02/28/2024  END OF SESSION:  PT End of Session - 02/28/24 1300     Visit Number 5    Number of Visits 8    Date for Recertification  11/30/23    Authorization Type [employer] [facility]    Authorization Time Period [place] approved 8 visits 11/02/23-05/05/24 [id number]    Authorization - Visit Number 5    Authorization - Number of Visits 8    PT Start Time 1230    PT Stop Time 1315    PT Time Calculation (min) 45 min    Activity Tolerance Patient tolerated treatment well    Behavior During Therapy [facility] for tasks assessed/performed              Past Medical History:  Diagnosis Date   Anxiety    Endometriosis    IBS (irritable bowel syndrome)    Rosacea    Past Surgical History:  Procedure Laterality Date   COLONOSCOPY     LAPAROSCOPIC OVARIAN CYSTECTOMY     PELVIC LAPAROSCOPY     Patient Active Problem List   Diagnosis Date Noted   Osteopenia after menopause 05/08/2018   Depression 05/08/2018    PCP: Verena Mems, MD  REFERRING PROVIDER: Alvia Dorothyann LABOR, MD   REFERRING DIAG: N32.81 (ICD-10-CM) - Overactive bladder  THERAPY DIAG:  Muscle weakness (generalized)  Unspecified lack of coordination  Abnormal posture  Rationale for Evaluation and Treatment: Rehabilitation  ONSET DATE: 2023  SUBJECTIVE:                                                                                                                                                                                           SUBJECTIVE STATEMENT: Patient has been out of PT for a bit due to her husband getting pneumonia and having a silent heart attack. Urinary control has been good but she hasn't been doing her exercises as much. Maybe 1 instance of leakage in the last several. She has been working on the urge drill. She is still taking magnesium.  Constipation has been better - it will sometimes come and the magnesium helps. She is currently using her estradiol  - supposed to be every other night.   Fluid intake: Her fluid intake includes 2 to 3 mugs of caffeinated coffee in the morning, 7 to 8 glasses of water throughout the day, and occasional sparkling water in the evening. She also consumes sodas or teas once a week and a glass of wine 4 to 5 times a week.   PAIN:  Are you having pain? No NPRS scale: 0/10  PRECAUTIONS: None  RED FLAGS: None   WEIGHT BEARING RESTRICTIONS: No  FALLS:  Has patient fallen in last 6 months? No  OCCUPATION: mostly retired, works occasionally for [employer] on an as needed basis   ACTIVITY LEVEL : walks, silver sneakers classes or online, tai chi  PLOF: Independent with basic ADLs  PATIENT GOALS: decrease urinary   PERTINENT HISTORY:  Hx: osteopenia after menopause, ovarian cyst removal, pelvic laparoscopy  Sexual abuse: No  BOWEL MOVEMENT: Pain with bowel movement: No Type of bowel movement:Type (Bristol Stool Scale) 2-3, Frequency daily , Strain no, and Splinting no Fully empty rectum: Yes:   Leakage: No Pads: No Fiber supplement/laxative Yes - takes magnesium   URINATION: Pain with urination: No Fully empty bladder: Nobut tries to lean back and go more  Stream: Strong Urgency: Yes  Frequency: within normal limits - gets up 1-3x/night  Leakage: Urge to void, Coughing, Sneezing, and Exercise Pads: No  INTERCOURSE:  Ability to have vaginal penetration No  Pain with intercourse: none DrynessYes   PREGNANCY: Vaginal deliveries 0 C-section deliveries 0  PROLAPSE: None  OBJECTIVE:  Note: Objective measures were completed at Evaluation unless otherwise noted.  PATIENT SURVEYS:  PFIQ-7: 72  COGNITION: Overall cognitive status: Within functional limits for tasks assessed     SENSATION: Light touch: Appears intact  LUMBAR SPECIAL TESTS:  Single leg stance test:  Positive  FUNCTIONAL TESTS:  Squat:   GAIT: Assistive device utilized: none Comments: mild trendelenburg gait pattern with ambulation   POSTURE: rounded shoulders, forward head, and flexed trunk   LUMBARAROM/PROM: within normal limits bilaterally   LOWER EXTREMITY ROM: within normal limits for bilateral lower extremities   LOWER EXTREMITY MMT: 4/5 bilateral knees and hips grossly   PALPATION:   General: no significant tenderness to palpation of bilateral adductors or hip flexors   Pelvic Alignment: within normal limits   Abdominal: upper chest breathing and abdominal bracing at rest, decreased lower rib excursion with inhalation                 External Perineal Exam: minimal dryness present with sufficient clitoral hood mobility, no tenderness to external pelvic floor muscle palpation                             Internal Pelvic Floor: Patient fully consents to today's internal examination. She demonstrates low tone throughout the pelvic floor with weakness present. She is unable to complete a pelvic floor contraction independently in hooklying, but with deep core activation paired with an exhale, she is able to create a small pelvic floor contraction. There is a lack of coordination present in the pelvic floor along with stiffness, most likely due to not using the musculature.   Patient confirms identification and approves PT to assess internal pelvic floor and treatment Yes No emotional/communication barriers or cognitive limitation. Patient is motivated to learn. Patient understands and agrees with treatment goals and plan. PT explains patient will be examined in standing, sitting, and lying down to see how their muscles and joints work. When they are ready, they will be asked to remove their underwear so PT can examine their perineum. The patient is also given the option of providing their own chaperone as one is not provided in our facility. The patient also has the right and is  explained the right to defer or refuse any part of the evaluation  or treatment including the internal exam. With the patient's consent, PT will use one gloved finger to gently assess the muscles of the pelvic floor, seeing how well it contracts and relaxes and if there is muscle symmetry. After, the patient will get dressed and PT and patient will discuss exam findings and plan of care. PT and patient discuss plan of care, schedule, attendance policy and HEP activities.  PELVIC MMT:   MMT eval  Vaginal 0/5, 0 quick flicks, 0 second hold   Internal Anal Sphincter   External Anal Sphincter   Puborectalis   Diastasis Recti   (Blank rows = not tested)  TONE: Low bilaterally in superficial and deep pelvic floor musculature   PROLAPSE: N/A   TODAY'S TREATMENT:                                                                                                                              DATE:   12/29/23: Sidelying clamshell (GTB) + diaphragmatic breathing 2x10  Sidelying reverse clamshell + diaphragmatic breathing 2x10  Bridge + hip abduction (GTB) + diaphragmatic breathing 2x10  Sit to stand + hip abduction (GTB) + diaphragmatic breathing 2x10  Standing pelvic floor contraction + diaphragmatic breathing 2x10  Education for performing urge drill in times of increased urgency   01/12/24: NuStep level 2 - 5 minutes - PT present to discuss current status  Standing pull down + single knee march + diaphragmatic breathing 2x10  Standing march + opposite shoulder press + diaphragmatic breathing 2x10  Squat + hip abduction (GTB) + diaphragmatic breathing 2x10   02/28/24: Standing pelvic floor contraction + diaphragmatic breathing 2x65min  NuStep level 2 - 5 minutes - PT present to discuss current status  Squat + GTB hip abduction + diaphragmatic breathing 2x10  Bridge + hip abduction (GTB) + diaphragmatic breathing 2x10  Seated hip abduction + GTB + diaphragmatic breathing 2x10  Seated adductor ball  squeeze + internal rotation against RTB + diaphragmatic breathing 2x12  Standing pull down (long GTB) + single leg march + diaphragmatic breathing 2x10  Standing march + alternating knee drive holding 2# weights + diaphragmatic breathing 2x10   PATIENT EDUCATION:  Education details: PENSION SCHEME MANAGER Person educated: Patient Education method: Explanation, Demonstration, Tactile cues, Verbal cues, and Handouts Education comprehension: verbalized understanding, returned demonstration, verbal cues required, tactile cues required, and needs further education  HOME EXERCISE PROGRAM: Access Code: [email or web address] URL: [email or web address] Date: 02/28/2024 Prepared by: Celena Domino  Exercises - Standing Pelvic Floor Contraction  - 1 x daily - 7 x weekly - 2 sets - 10 reps - Squat with Resistance at Thighs  - 1 x daily - 7 x weekly - 2 sets - 10 reps - Bridge with Hip Abduction and Resistance  - 1 x daily - 7 x weekly - 2 sets - 10 reps - Seated Hip Abduction with Resistance  - 1 x daily - 7 x weekly - 2 sets - 10 reps -  Seated Hip Internal Rotation with Ball and Resistance  - 1 x daily - 7 x weekly - 2 sets - 10 reps - Resistance Pulldown with March  - 1 x daily - 7 x weekly - 2 sets - 10 reps - Standing March with Alternating Med [employer]  - 1 x daily - 7 x weekly - 2 sets - 10 reps  ASSESSMENT:  CLINICAL IMPRESSION: Patient is a 73 y.o. female  who was seen today for physical therapy treatment for urinary incontinence. She has been noticing improvements in her ability to control her urgency and leakage. All exercises reviewed and progressed with banded resistance. 0/10 pain at end of session, max cueing required from PT to properly coordinate breathing cues with exercises today. Overall patient tolerated session well and Pt would benefit from additional PT to further address deficits.     OBJECTIVE IMPAIRMENTS: decreased coordination, decreased endurance, decreased mobility, decreased ROM,  decreased strength, and pain.   ACTIVITY LIMITATIONS: continence  PARTICIPATION LIMITATIONS: community activity  PERSONAL FACTORS: Age, Past/current experiences, and Time since onset of injury/illness/exacerbation are also affecting patient's functional outcome.   REHAB POTENTIAL: Good  CLINICAL DECISION MAKING: Stable/uncomplicated  EVALUATION COMPLEXITY: Low   GOALS: Goals reviewed with patient? Yes  SHORT TERM GOALS: Target date: 11/30/2023  Pt will be independent with HEP.  Baseline: Goal status: INITIAL  2.  Pt will be independent with the knack, urge suppression technique, and double voiding in order to improve bladder habits and decrease urinary incontinence.   Baseline:  Goal status: INITIAL  3.  Pt will be independent with use of squatty potty, relaxed toileting mechanics, and improved bowel movement techniques in order to increase ease of bowel movements and complete evacuation.   Baseline:  Goal status: INITIAL  LONG TERM GOALS: Target date: 05/04/2024  Pt will be independent with advanced HEP.  Baseline:  Goal status: INITIAL  2.  Pt to demonstrate improved coordination of pelvic floor and breathing mechanics with 10# squat with appropriate synergistic patterns to decrease pain and leakage at least 75% of the time for improved ability to complete a 30 minute workout with strain at pelvic floor and symptoms.   Baseline:  Goal status: INITIAL  3.  Pt will be able to functional actions such as exercise/cough/laugh without leakage to decrease need for pad use and improve quality of life.  Baseline:  Goal status: INITIAL  4.  Pt will have 80% reduced leakage during a typical day to improve quality of life.  Baseline:  Goal status: INITIAL  PLAN:  PT FREQUENCY: 1-2x/week  PT DURATION: 12 weeks  PLANNED INTERVENTIONS: 97110-Therapeutic exercises, 97530- Therapeutic activity, 97112- Neuromuscular re-education, 97535- Self Care, [id number]- Manual therapy,  Patient/Family education, Taping, Joint mobilization, Spinal mobilization, Scar mobilization, Cryotherapy, and Moist heat  PLAN FOR NEXT SESSION: continued pelvic floor training in seated, introduce glute and hip strengthening, introduce knack technique and urge drill   Celena JAYSON Domino, PT 02/28/2024, 1:16 PM

## 2024-03-28 ENCOUNTER — Ambulatory Visit: Attending: Obstetrics and Gynecology | Admitting: Physical Therapy

## 2024-03-28 DIAGNOSIS — R279 Unspecified lack of coordination: Secondary | ICD-10-CM | POA: Insufficient documentation

## 2024-03-28 DIAGNOSIS — R293 Abnormal posture: Secondary | ICD-10-CM | POA: Insufficient documentation

## 2024-03-28 DIAGNOSIS — M6281 Muscle weakness (generalized): Secondary | ICD-10-CM | POA: Insufficient documentation

## 2024-04-04 ENCOUNTER — Telehealth: Payer: Self-pay

## 2024-04-04 ENCOUNTER — Ambulatory Visit: Admitting: Physical Therapy

## 2024-04-04 NOTE — Telephone Encounter (Signed)
 Telephone call to patient to schedule colonoscopy with Dr. Avram per recall assessment sheet.  VM obtained and message left to call back and ask for scheduling to schedule PV and colonoscopy.

## 2024-04-09 ENCOUNTER — Ambulatory Visit: Admitting: Physical Therapy

## 2024-04-09 DIAGNOSIS — R279 Unspecified lack of coordination: Secondary | ICD-10-CM

## 2024-04-09 DIAGNOSIS — M6281 Muscle weakness (generalized): Secondary | ICD-10-CM

## 2024-04-09 DIAGNOSIS — R293 Abnormal posture: Secondary | ICD-10-CM

## 2024-04-09 NOTE — Therapy (Signed)
 OUTPATIENT PHYSICAL THERAPY FEMALE PELVIC TREATMENT   Patient Name: Terri Henderson MRN: [id number] DOB:Aug 10, 1950, 73 y.o., female Today's Date: 04/09/2024  END OF SESSION:  PT End of Session - 04/09/24 1519     Visit Number 6    Number of Visits 8    Date for Recertification  11/30/23    Authorization Type [employer] [facility]    Authorization Time Period Cohere approved 8 visits 11/02/23-05/05/24 [id number]    Authorization - Visit Number 6    Authorization - Number of Visits 8    PT Start Time 0230    PT Stop Time 0325    PT Time Calculation (min) 55 min    Activity Tolerance Patient tolerated treatment well    Behavior During Therapy [facility] for tasks assessed/performed               Past Medical History:  Diagnosis Date   Anxiety    Endometriosis    IBS (irritable bowel syndrome)    Rosacea    Past Surgical History:  Procedure Laterality Date   COLONOSCOPY     LAPAROSCOPIC OVARIAN CYSTECTOMY     PELVIC LAPAROSCOPY     Patient Active Problem List   Diagnosis Date Noted   Osteopenia after menopause 05/08/2018   Depression 05/08/2018    PCP: Verena Mems, MD  REFERRING PROVIDER: Alvia Dorothyann LABOR, MD   REFERRING DIAG: N32.81 (ICD-10-CM) - Overactive bladder  THERAPY DIAG:  Muscle weakness (generalized)  Unspecified lack of coordination  Abnormal posture  Rationale for Evaluation and Treatment: Rehabilitation  ONSET DATE: 2023  SUBJECTIVE:                                                                                                                                                                                           SUBJECTIVE STATEMENT: Patient reports that her pelvic floor has been doing very well. She has had maybe 1 or 2 instances where she might have had a high sense of urgency. She has been getting up usually 1x/night to void and this is much better. There was one instance where she sneezed and leaked a small amount of urine. Bowel  movements have been regular - magnesium is helping with this.    Fluid intake: Her fluid intake includes 2 to 3 mugs of caffeinated coffee in the morning, 7 to 8 glasses of water throughout the day, and occasional sparkling water in the evening. She also consumes sodas or teas once a week and a glass of wine 4 to 5 times a week.   PAIN:  Are you having pain? No NPRS scale: 0/10  PRECAUTIONS: None  RED FLAGS: None   WEIGHT BEARING RESTRICTIONS: No  FALLS:  Has patient fallen in last 6 months? No  OCCUPATION: mostly retired, works occasionally for [employer] on an as needed basis   ACTIVITY LEVEL : walks, silver sneakers classes or online, tai chi  PLOF: Independent with basic ADLs  PATIENT GOALS: decrease urinary   PERTINENT HISTORY:  Hx: osteopenia after menopause, ovarian cyst removal, pelvic laparoscopy  Sexual abuse: No  BOWEL MOVEMENT: Pain with bowel movement: No Type of bowel movement:Type (Bristol Stool Scale) 2-3, Frequency daily , Strain no, and Splinting no Fully empty rectum: Yes:   Leakage: No Pads: No Fiber supplement/laxative Yes - takes magnesium   URINATION: Pain with urination: No Fully empty bladder: Nobut tries to lean back and go more  Stream: Strong Urgency: Yes  Frequency: within normal limits - gets up 1-3x/night  Leakage: Urge to void, Coughing, Sneezing, and Exercise Pads: No  INTERCOURSE:  Ability to have vaginal penetration No  Pain with intercourse: none DrynessYes   PREGNANCY: Vaginal deliveries 0 C-section deliveries 0  PROLAPSE: None  OBJECTIVE:  Note: Objective measures were completed at Evaluation unless otherwise noted.  PATIENT SURVEYS:  PFIQ-7: 4  COGNITION: Overall cognitive status: Within functional limits for tasks assessed     SENSATION: Light touch: Appears intact  LUMBAR SPECIAL TESTS:  Single leg stance test: Positive  FUNCTIONAL TESTS:  Squat:   GAIT: Assistive device utilized: none Comments:  mild trendelenburg gait pattern with ambulation   POSTURE: rounded shoulders, forward head, and flexed trunk   LUMBARAROM/PROM: within normal limits bilaterally   LOWER EXTREMITY ROM: within normal limits for bilateral lower extremities   LOWER EXTREMITY MMT: 4/5 bilateral knees and hips grossly   PALPATION:   General: no significant tenderness to palpation of bilateral adductors or hip flexors   Pelvic Alignment: within normal limits   Abdominal: upper chest breathing and abdominal bracing at rest, decreased lower rib excursion with inhalation                 External Perineal Exam: minimal dryness present with sufficient clitoral hood mobility, no tenderness to external pelvic floor muscle palpation                             Internal Pelvic Floor: Patient fully consents to today's internal examination. She demonstrates low tone throughout the pelvic floor with weakness present. She is unable to complete a pelvic floor contraction independently in hooklying, but with deep core activation paired with an exhale, she is able to create a small pelvic floor contraction. There is a lack of coordination present in the pelvic floor along with stiffness, most likely due to not using the musculature.   Patient confirms identification and approves PT to assess internal pelvic floor and treatment Yes No emotional/communication barriers or cognitive limitation. Patient is motivated to learn. Patient understands and agrees with treatment goals and plan. PT explains patient will be examined in standing, sitting, and lying down to see how their muscles and joints work. When they are ready, they will be asked to remove their underwear so PT can examine their perineum. The patient is also given the option of providing their own chaperone as one is not provided in our facility. The patient also has the right and is explained the right to defer or refuse any part of the evaluation or treatment including the  internal exam. With the patient's consent, PT will  use one gloved finger to gently assess the muscles of the pelvic floor, seeing how well it contracts and relaxes and if there is muscle symmetry. After, the patient will get dressed and PT and patient will discuss exam findings and plan of care. PT and patient discuss plan of care, schedule, attendance policy and HEP activities.  PELVIC MMT:   MMT eval  Vaginal 0/5, 0 quick flicks, 0 second hold   Internal Anal Sphincter   External Anal Sphincter   Puborectalis   Diastasis Recti   (Blank rows = not tested)  TONE: Low bilaterally in superficial and deep pelvic floor musculature   PROLAPSE: N/A   TODAY'S TREATMENT:                                                                                                                              DATE:   12/29/23: Sidelying clamshell (GTB) + diaphragmatic breathing 2x10  Sidelying reverse clamshell + diaphragmatic breathing 2x10  Bridge + hip abduction (GTB) + diaphragmatic breathing 2x10  Sit to stand + hip abduction (GTB) + diaphragmatic breathing 2x10  Standing pelvic floor contraction + diaphragmatic breathing 2x10  Education for performing urge drill in times of increased urgency   01/12/24: NuStep level 2 - 5 minutes - PT present to discuss current status  Standing pull down + single knee march + diaphragmatic breathing 2x10  Standing march + opposite shoulder press + diaphragmatic breathing 2x10  Squat + hip abduction (GTB) + diaphragmatic breathing 2x10   02/28/24: Standing pelvic floor contraction + diaphragmatic breathing 2x76min  NuStep level 2 - 5 minutes - PT present to discuss current status  Squat + GTB hip abduction + diaphragmatic breathing 2x10  Bridge + hip abduction (GTB) + diaphragmatic breathing 2x10  Seated hip abduction + GTB + diaphragmatic breathing 2x10  Seated adductor ball squeeze + internal rotation against RTB + diaphragmatic breathing 2x12  Standing pull down  (long GTB) + single leg march + diaphragmatic breathing 2x10  Standing march + alternating knee drive holding 2# weights + diaphragmatic breathing 2x10   04/09/24: NuStep level 2 - 5 minutes - PT present to discuss current status  Seated cough with pelvic floor contraction + hand to mouth 2x5  Seated abdominal ball press + transverse abdominis contraction 2x10 Bird dog + diaphragmatic breathing 2x10  Bodyweight bridge + diaphragmatic breathing x10  Bridge + single leg knee extension + diaphragmatic breathing 2x10  Seated hip abduction (GTB) + diaphragmatic breathing x12  Seated hip abduction (bluTB) + diaphragmatic breathing x12 Seated adductor ball squeeze + internal rotation against RTB + diaphragmatic breathing 2x12 Monster walks forward / backward x10 steps each (blueTB) Sit to stand holding 5# kettlebell + diaphragmatic breathing x10   Squat + 5# KB + diaphragmatic breathing x10   PATIENT EDUCATION:  Education details: PENSION SCHEME MANAGER Person educated: Patient Education method: Explanation, Demonstration, Tactile cues, Verbal cues, and Handouts Education comprehension: verbalized understanding, returned demonstration, verbal cues  required, tactile cues required, and needs further education  HOME EXERCISE PROGRAM: Access Code: [email or web address] URL: [email or web address] Date: 04/09/2024 Prepared by: Celena Domino  Exercises - Standing Pelvic Floor Contraction  - 1 x daily - 7 x weekly - 2 sets - 10 reps - Seated Cough with Pelvic Floor Contraction and Hand to Mouth  - 1 x daily - 7 x weekly - 2 sets - 10 reps - Squat  - 1 x daily - 7 x weekly - 2 sets - 10 reps - Supine Bridge with Leg Extension  - 1 x daily - 7 x weekly - 2 sets - 10 reps - Seated Hip Abduction with Resistance  - 1 x daily - 7 x weekly - 2 sets - 12 reps - Seated Hip Internal Rotation with Ball and Resistance  - 1 x daily - 7 x weekly - 2 sets - 10 reps - Resistance Pulldown with March  - 1 x daily - 7 x weekly -  2 sets - 10 reps - Standing March with Alternating [facility]  - 1 x daily - 7 x weekly - 2 sets - 10 reps - Bird Dog  - 1 x daily - 7 x weekly - 2 sets - 10 reps - Seated Abdominal Press into [employer]  - 1 x daily - 7 x weekly - 2 sets - 10 reps - Forward Monster Walks  - 1 x daily - 7 x weekly - 2 sets - 10 reps - Backward Monster Walks  - 1 x daily - 7 x weekly - 2 sets - 10 reps  ASSESSMENT:  CLINICAL IMPRESSION: Patient is a 73 y.o. female  who was seen today for physical therapy treatment for urinary incontinence. She has been noticing improvements in her ability to control her urgency and leakage. All exercises reviewed and progressed with banded resistance. Pt is now able to squat 5 # weight no leakage. 0/10 pain at end of session, max cueing required from PT to properly coordinate breathing cues with exercises today. Overall patient tolerated session well and Pt would benefit from additional PT to further address deficits.     OBJECTIVE IMPAIRMENTS: decreased coordination, decreased endurance, decreased mobility, decreased ROM, decreased strength, and pain.   ACTIVITY LIMITATIONS: continence  PARTICIPATION LIMITATIONS: community activity  PERSONAL FACTORS: Age, Past/current experiences, and Time since onset of injury/illness/exacerbation are also affecting patient's functional outcome.   REHAB POTENTIAL: Good  CLINICAL DECISION MAKING: Stable/uncomplicated  EVALUATION COMPLEXITY: Low   GOALS: Goals reviewed with patient? Yes  SHORT TERM GOALS: Target date: 11/30/2023  Pt will be independent with HEP.  Baseline: Goal status: INITIAL  2.  Pt will be independent with the knack, urge suppression technique, and double voiding in order to improve bladder habits and decrease urinary incontinence.   Baseline:  Goal status: INITIAL  3.  Pt will be independent with use of squatty potty, relaxed toileting mechanics, and improved bowel movement techniques in order to increase  ease of bowel movements and complete evacuation.   Baseline:  Goal status: INITIAL  LONG TERM GOALS: Target date: 05/04/2024  Pt will be independent with advanced HEP.  Baseline:  Goal status: INITIAL  2.  Pt to demonstrate improved coordination of pelvic floor and breathing mechanics with 10# squat with appropriate synergistic patterns to decrease pain and leakage at least 75% of the time for improved ability to complete a 30 minute workout with strain at pelvic floor and symptoms.   Baseline:  Goal  status: INITIAL  3.  Pt will be able to functional actions such as exercise/cough/laugh without leakage to decrease need for pad use and improve quality of life.  Baseline:  Goal status: INITIAL  4.  Pt will have 80% reduced leakage during a typical day to improve quality of life.  Baseline:  Goal status: INITIAL  PLAN:  PT FREQUENCY: 1-2x/week  PT DURATION: 12 weeks  PLANNED INTERVENTIONS: 97110-Therapeutic exercises, 97530- Therapeutic activity, 97112- Neuromuscular re-education, 97535- Self Care, [id number]- Manual therapy, Patient/Family education, Taping, Joint mobilization, Spinal mobilization, Scar mobilization, Cryotherapy, and Moist heat  PLAN FOR NEXT SESSION: continued pelvic floor training in seated, introduce glute and hip strengthening, introduce knack technique and urge drill   Celena JAYSON Domino, PT 04/09/2024, 3:21 PM
